# Patient Record
Sex: Female | Born: 2000 | Hispanic: Yes | Marital: Single | Smoking: Never smoker
Health system: Southern US, Community
[De-identification: ages and names within clinical notes are randomized; demographics above are authoritative.]

## PROBLEM LIST (undated history)

## (undated) DIAGNOSIS — T7840XA Allergy, unspecified, initial encounter: Secondary | ICD-10-CM

## (undated) HISTORY — DX: Allergy, unspecified, initial encounter: T78.40XA

---
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
## 2018-01-02 ENCOUNTER — Ambulatory Visit: Payer: BC Managed Care – PPO | Admitting: Family Medicine

## 2018-01-02 ENCOUNTER — Encounter: Payer: Self-pay | Admitting: Family Medicine

## 2018-01-02 VITALS — BP 113/71 | HR 63 | Ht 62.0 in | Wt 111.0 lb

## 2018-01-02 DIAGNOSIS — Z30011 Encounter for initial prescription of contraceptive pills: Secondary | ICD-10-CM

## 2018-01-02 MED ORDER — LO LOESTRIN FE 1 MG-10 MCG / 10 MCG PO TABS: 1.0000 | ORAL_TABLET | Freq: Every day | ORAL | 3 refills | Status: DC

## 2018-01-02 NOTE — Progress Notes (Signed)
   Subjective:    Patient ID: Stacey Mayer, female    DOB: January 06, 2001, 17 y.o.   MRN: [id number]  HPI Patient seen for contraception management.  Patient was seen approximately 16 months ago by a different OB.  She was given a 1 month sample of Lo Loestrin for contraception which she took without complications.  She never followed up with that office.  She has engaged in unprotected sex, but has not become pregnant.  She has normal periods that are about every 28 to 30 days.  Normally bleeds to 5 to 6 days and is moderately heavy.  Would like to continue on birth control pills.  No personal or family history of blood clots.  Patient is a non-smoker.  No medications.  I have reviewed the patients past medical, family, and social history.  I have reviewed the patient's medication list and allergies.  Review of Systems     Objective:   Physical Exam  Constitutional: She is oriented to person, place, and time. She appears well-developed and well-nourished.  Cardiovascular: Normal rate and regular rhythm.  Pulmonary/Chest: No stridor. No respiratory distress. She has no wheezes. She has no rales.  Abdominal: Soft. She exhibits no distension. There is no tenderness.  Musculoskeletal: Normal range of motion. She exhibits no edema or deformity.  Neurological: She is alert and oriented to person, place, and time.  Skin: Capillary refill takes 2 to 3 seconds.  Psychiatric: She has a normal mood and affect. Her behavior is normal. Thought content normal.      Assessment & Plan:  1. Encounter for initial prescription of contraceptive pills Discussing pros and cons of different pill choices.  We will restart the patient on low Loestrin.  Discussed possibility of breakthrough spotting and bleeding.  Also discussed incidence of DVTs and most common side effects.  Patient follow-up in 3 to 4 months to evaluate problems with contraception.  We also discussed how to take medication and what to do when she  misses a dose.  Also discussed that OCPs do not protect her against STDs and recommended using barrier devices such as condoms.

## 2018-10-22 ENCOUNTER — Other Ambulatory Visit: Payer: Self-pay

## 2018-10-22 ENCOUNTER — Ambulatory Visit (INDEPENDENT_AMBULATORY_CARE_PROVIDER_SITE_OTHER): Payer: BC Managed Care – PPO

## 2018-10-22 VITALS — BP 140/80 | HR 88 | Temp 98.2°F

## 2018-10-22 DIAGNOSIS — N898 Other specified noninflammatory disorders of vagina: Secondary | ICD-10-CM | POA: Diagnosis not present

## 2018-10-22 DIAGNOSIS — N760 Acute vaginitis: Secondary | ICD-10-CM

## 2018-10-22 DIAGNOSIS — B9689 Other specified bacterial agents as the cause of diseases classified elsewhere: Secondary | ICD-10-CM | POA: Diagnosis not present

## 2018-10-22 DIAGNOSIS — Z113 Encounter for screening for infections with a predominantly sexual mode of transmission: Secondary | ICD-10-CM | POA: Diagnosis not present

## 2018-10-22 NOTE — Progress Notes (Signed)
Patient states she has noticed an increase in white'/yellowish discharge since starting birth control. Denies any new sexual partners and states she is not currently sexually active.   Patient does what GC/CHl run and gave verbal consent for it.   Patient is not having any itching or burning. Patient doesn't really notice any smell or odor to discharge. Patient states she is wearing a panty liner everyday - encourage to try an not do that as it can harbor more bacteria. Encouraged to try and take a probiotic every day as well.   Will send self swab for culture and recommend treatment off once results are back.  Armandina Stammer RN

## 2018-10-23 ENCOUNTER — Other Ambulatory Visit: Payer: Self-pay | Admitting: Family Medicine

## 2018-10-26 ENCOUNTER — Telehealth: Payer: Self-pay

## 2018-10-26 DIAGNOSIS — N760 Acute vaginitis: Secondary | ICD-10-CM

## 2018-10-26 DIAGNOSIS — B9689 Other specified bacterial agents as the cause of diseases classified elsewhere: Secondary | ICD-10-CM

## 2018-10-26 DIAGNOSIS — A749 Chlamydial infection, unspecified: Secondary | ICD-10-CM

## 2018-10-26 LAB — CERVICOVAGINAL ANCILLARY ONLY
Bacterial vaginitis: POSITIVE — AB
Candida vaginitis: NEGATIVE
Chlamydia: POSITIVE — AB
Neisseria Gonorrhea: NEGATIVE

## 2018-10-26 MED ORDER — AZITHROMYCIN 500 MG PO TABS: 1000.0000 mg | ORAL_TABLET | Freq: Every day | ORAL | 0 refills | Status: AC

## 2018-10-26 MED ORDER — METRONIDAZOLE 500 MG PO TABS: 500.0000 mg | ORAL_TABLET | Freq: Two times a day (BID) | ORAL | 0 refills | Status: DC

## 2018-10-26 NOTE — Telephone Encounter (Signed)
Called pt with results.Pt made aware that she tested positive for Chlamydia and BV. Pt advised to not have intercourse for ten days after she and partner are treated. Medication was sent to the pharmacy. STD form was fax[facility] to [facility]. Understanding was voiced.

## 2018-11-10 ENCOUNTER — Telehealth: Payer: Self-pay

## 2018-11-10 NOTE — Telephone Encounter (Signed)
Pt called the message line stating that she had some yellow discharge after being treated for BV and chlamydia. Pt also states that her menstrual went off during the time that she finished her antibiotics two days ago. Advised pt to give it a few days to see if symptoms improve since she just finished her treatment. Pt states that at this time she is not having any discharge. Pt advised to call back if she has any questions. Understanding was voiced.  Stacey Mayer l Rashaun Curl, CMA

## 2018-11-16 ENCOUNTER — Telehealth: Payer: Self-pay

## 2018-11-16 MED ORDER — FLUCONAZOLE 100 MG PO TABS: 100.0000 mg | ORAL_TABLET | Freq: Every day | ORAL | 1 refills | Status: DC

## 2018-11-16 NOTE — Telephone Encounter (Signed)
Patient called and has finished all antibiotics for BC and CHl. Patient is now having vaginal itching and a slight discharge. Patient instructed to take on diflucan now and can repeat after 48 hours. Armandina Stammer RN

## 2018-11-26 ENCOUNTER — Other Ambulatory Visit: Payer: Self-pay | Admitting: Family Medicine

## 2018-12-08 ENCOUNTER — Ambulatory Visit: Payer: BC Managed Care – PPO | Admitting: Advanced Practice Midwife

## 2018-12-08 ENCOUNTER — Other Ambulatory Visit: Payer: Self-pay

## 2018-12-08 ENCOUNTER — Encounter: Payer: Self-pay | Admitting: Advanced Practice Midwife

## 2018-12-08 VITALS — BP 120/64 | HR 68 | Wt 105.0 lb

## 2018-12-08 DIAGNOSIS — N898 Other specified noninflammatory disorders of vagina: Secondary | ICD-10-CM

## 2018-12-08 NOTE — Patient Instructions (Signed)
Menstruation  Menstruation, also known as a menstrual period, is the monthly shedding of the lining of the uterus. The uterus is the organ in the lower abdomen where a baby grows during pregnancy. Menstruation involves the passing of blood, tissue, fluid, and mucus. The flow of blood usually occurs during 3-7 consecutive days each month. Girls usually start their periods between the ages of 12 and 8, but some girls may be older or younger when they start their period. Some girls have regular monthly menstrual cycles right from the beginning. However, it is not unusual to have only a couple of drops of blood or spotting when first starting to have periods. It is also not unusual to have two periods a month or miss a month or two when first starting to have periods. Women will continue to have periods until they reach menopause, which usually occurs between the ages of 12 and 78. What are the symptoms? During your period, you pass blood, tissue, fluid, and mucus out of your vagina. Periods are different for each woman and girl. You may experience:  Bleeding that lasts for 3-7 days. A little more or less bleeding is normal.  Occasional heavy bleeding.  Cramps in the lower abdomen.  Aching or pain in the lower back area.  Sore breasts.  Dizziness.  Nausea.  Diarrhea. Other symptoms may occur 5-10 days before your menstrual period starts. These symptoms are referred to as premenstrual syndrome (PMS). These symptoms can include:  Headache.  Breast tenderness and swelling.  Bloating.  Tiredness (fatigue).  Mood changes.  Craving for certain foods. How does the menstrual cycle happen? A period is part of a woman's menstrual cycle, which is a series of changes that the body goes through to get ready to become pregnant. The menstrual cycle usually lasts about 28 days, meaning that you will get your period about every 28 days if you do not get pregnant. However, some women get their periods  as soon as every 21 days or as late as every 35 days. Hormones control the menstrual cycle. Hormones are chemicals that the body produces to regulate different body functions. These hormones trigger changes in your uterus. Every month, the lining of your uterus gets thicker to prepare for pregnancy. And every month that you do not get pregnant, your uterus gets rid of its thick lining and cleans itself out. This is your period. How do I know if my period is not normal? Periods are different for everyone. Your period may last for a longer or shorter time than usual, and bleeding may be light or heavy. Signs that your period may not be normal include:  Bleeding very heavily, such as soaking through a tampon or pad in 1-2 hours.  Bleeding for many more days than normal.  Bleeding after you have sex.  Cramps that are so painful that you cannot do your daily activities.  Cramps that get much worse than they used to be.  Bleeding in between periods.  Missing your period for longer than 3 months.  Your menstrual cycle becoming irregular, when it used to be regular. Follow these instructions at home:  Keep track of your periods by using a calendar.  If you use tampons, use the least absorbent possible to avoid complications such as toxic shock syndrome.  Do not leave tampons in the vagina overnight or longer than 8 hours.  Wear a sanitary pad overnight.  To help relieve pain and discomfort during your period: ? Take an  over-the-counter pain reliever as told by your health care provider. ? Use a heating pad or heat wrap on your abdomen to ease cramping. ? Exercise 3-5 times a week or more. ? Avoid foods and drinks that you know will make your symptoms worse before or during your period. This includes foods that contain:  Caffeine.  Salt.  Sugar. Contact a health care provider if:  You have signs that your period may not be normal.  You develop a fever with your period.  Your  periods are lasting more than 7 days.  You develop clots with your period and never had clots before.  You cannot get relief for your symptoms from over-the-counter medicine.  Your period has not started, and it has been longer than 35 days. Get help right away if:  Your period is so heavy that you have to change pads or tampons every 30 minutes.  You have any symptoms of toxic shock syndrome (TSS), such as: ? A high fever. ? Vomiting or diarrhea. ? Red skin that looks like a sunburn. ? Red eyes. ? Fainting or feeling dizzy. ? Sore throat. ? Muscle aches. If you develop any of these symptoms, visit your health care provider immediately. TSS is a serious health condition that can be caused by wearing a tampon for too long. Summary  Menstruation, also known as a menstrual period, is the monthly shedding of the lining of the uterus.  During your period, you pass blood, tissue, fluid, and mucus out of your vagina.  Keep track of your periods by using a calendar.  Contact a health care provider if you have signs that your period may not be normal. This information is not intended to replace advice given to you by your health care provider. Make sure you discuss any questions you have with your health care provider. Document Released: 05/24/2002 Document Revised: 07/31/2016 Document Reviewed: 07/31/2016 Elsevier Interactive Patient Education  2019 [employer] Safe Sex Practicing safe sex means taking steps before and during sex to reduce your risk of:  Getting an STD (sexually transmitted disease).  Giving your partner an STD.  Unwanted pregnancy. How can I practice safe sex? To practice safe sex:  Limit your sexual partners to only one partner who is having sex with only you.  Avoid using alcohol and recreational drugs before having sex. These substances can affect your judgment.  Before having sex with a new partner: ? Talk to your partner about past partners, past  STDs, and drug use. ? You and your partner should be screened for STDs and discuss the results with each other.  Check your body regularly for sores, blisters, rashes, or unusual discharge. If you notice any of these problems, visit your health care provider.  If you have symptoms of an infection or you are being treated for an STD, avoid sexual contact.  While having sex, use a condom. Make sure to: ? Use a condom every time you have vaginal, oral, or anal sex. Both females and males should wear condoms during oral sex. ? Keep condoms in place from the beginning to the end of sexual activity. ? Use a latex condom, if possible. Latex condoms offer the best protection. ? Use only water-based lubricants or oils to lubricate a condom. Using petroleum-based lubricants or oils will weaken the condom and increase the chance that it will break.  See your health care provider for regular screenings, exams, and tests for STDs.  Talk with your health care provider  about the form of birth control (contraception) that is best for you.  Get vaccinated against hepatitis B and human papillomavirus (HPV).  If you are at risk of being infected with HIV (human immunodeficiency virus), talk with your health care provider about taking a prescription medicine to prevent HIV infection. You are considered at risk for HIV if: ? You are a man who has sex with other men. ? You are a heterosexual man or woman who is sexually active with more than one partner. ? You take drugs by injection. ? You are sexually active with a partner who has HIV. This information is not intended to replace advice given to you by your health care provider. Make sure you discuss any questions you have with your health care provider. Document Released: 07/11/2004 Document Revised: 10/18/2015 Document Reviewed: 04/23/2015 Elsevier Interactive Patient Education  2019 [employer]

## 2018-12-08 NOTE — Progress Notes (Signed)
Patient had vaginal self swab on 10-22-18 positive for chl and BV and was treated for those. Also treated four days later on 11-10-2018 with diflucan. Patient still having discharge.

## 2018-12-09 ENCOUNTER — Encounter: Payer: Self-pay | Admitting: Advanced Practice Midwife

## 2018-12-09 LAB — CERVICOVAGINAL ANCILLARY ONLY
Bacterial vaginitis: POSITIVE — AB
Candida vaginitis: NEGATIVE
Chlamydia: NEGATIVE
Neisseria Gonorrhea: NEGATIVE
Trichomonas: NEGATIVE

## 2018-12-09 NOTE — Progress Notes (Signed)
   Subjective:    Patient ID: Stacey Mayer, female    DOB: 11-02-2000, 18 y.o.   MRN: [id number]  Vaginal Discharge The patient's primary symptoms include vaginal discharge. The patient's pertinent negatives include no genital itching, genital lesions, genital odor, pelvic pain or vaginal bleeding. This is a recurrent problem. The current episode started in the past 7 days. The problem occurs constantly. The problem has been unchanged. The patient is experiencing no pain. She is not pregnant. Pertinent negatives include no abdominal pain, back pain, chills, constipation or fever. The vaginal discharge was white. There has been no bleeding. Nothing aggravates the symptoms. She has tried nothing for the symptoms. Sexual activity: States no longer with former partner.      Review of Systems  Constitutional: Negative for chills and fever.  Gastrointestinal: Negative for abdominal pain and constipation.  Genitourinary: Positive for vaginal discharge. Negative for pelvic pain.  Musculoskeletal: Negative for back pain.       Objective:   Physical Exam Constitutional:      General: She is not in acute distress.    Appearance: Normal appearance.  Neck:     Musculoskeletal: Normal range of motion.  Cardiovascular:     Rate and Rhythm: Normal rate.  Pulmonary:     Effort: Pulmonary effort is normal.  Abdominal:     General: There is no distension.     Tenderness: There is no abdominal tenderness. There is no guarding.  Genitourinary:    General: Normal vulva.     Vagina: Vaginal discharge (scant) present.  Skin:    General: Skin is warm and dry.  Neurological:     General: No focal deficit present.     Mental Status: She is alert.  Psychiatric:        Mood and Affect: Mood normal.           Assessment & Plan:  A:  Vaginal discharge, likely physiologic      Hx chlamydia and BV, test of cure  P;   Discussed the discharge is likely physiologic       Will send culture but not  treat at this time

## 2018-12-10 ENCOUNTER — Telehealth: Payer: Self-pay

## 2018-12-10 DIAGNOSIS — B9689 Other specified bacterial agents as the cause of diseases classified elsewhere: Secondary | ICD-10-CM

## 2018-12-10 DIAGNOSIS — N760 Acute vaginitis: Secondary | ICD-10-CM

## 2018-12-10 NOTE — Telephone Encounter (Signed)
Called pt about positive BV results, left message for pt to call the office back.  chiquita l wilson, CMA

## 2018-12-11 MED ORDER — METRONIDAZOLE 500 MG PO TABS
500.0000 mg | ORAL_TABLET | Freq: Two times a day (BID) | ORAL | 0 refills | Status: DC
Start: 2018-12-11 — End: 2019-03-19

## 2018-12-11 MED ORDER — FLUCONAZOLE 100 MG PO TABS: 100.0000 mg | ORAL_TABLET | Freq: Every day | ORAL | 1 refills | Status: DC

## 2018-12-11 NOTE — Telephone Encounter (Signed)
Patient call back and made aware of positive bacterial vaginosis results. Patient made aware to take all of her flagyl and that she make take a diflucan at the end of the treatment if she is having yeast symptoms.   Patient and I discuss way to help with recurrent BV> for example: wearing loose cotton fitting underwear, avoid scent products like panty liners, use Dove unscented soap products. Patient states understanding. Kathrene Alu RN

## 2018-12-23 ENCOUNTER — Telehealth: Payer: Self-pay

## 2018-12-23 NOTE — Telephone Encounter (Signed)
Pt called and left message on nurse line about abnormal vaginal discharge. Left message for pt to call the office back.  Tayra Dawe l Nattalie Santiesteban, CMA

## 2018-12-23 NOTE — Telephone Encounter (Signed)
Pt called the office stating discharge seems heavy when she is active. Pt was treated for BV over 1 week ago. Pt advised to call after her cycle if she is still having discharge. Understanding was voiced.  chiquita l wilson, CMA

## 2019-01-05 ENCOUNTER — Other Ambulatory Visit: Payer: Self-pay

## 2019-01-05 ENCOUNTER — Ambulatory Visit (INDEPENDENT_AMBULATORY_CARE_PROVIDER_SITE_OTHER): Payer: BC Managed Care – PPO | Admitting: Advanced Practice Midwife

## 2019-01-05 ENCOUNTER — Encounter: Payer: Self-pay | Admitting: Advanced Practice Midwife

## 2019-01-05 VITALS — BP 105/60 | HR 62 | Ht 62.0 in | Wt 107.0 lb

## 2019-01-05 DIAGNOSIS — Z113 Encounter for screening for infections with a predominantly sexual mode of transmission: Secondary | ICD-10-CM | POA: Diagnosis not present

## 2019-01-05 DIAGNOSIS — N898 Other specified noninflammatory disorders of vagina: Secondary | ICD-10-CM | POA: Diagnosis not present

## 2019-01-05 NOTE — Progress Notes (Deleted)
  Subjective:    Stacey Mayer is being seen today for her first obstetrical visit.  This {is/is not:9024} a planned pregnancy. She is at Unknown gestation. Her obstetrical history is significant for {ob risk factors:10154}. Relationship with FOB: {fob:16621}. Patient {does/does not:19097} intend to breast feed. Pregnancy history fully reviewed.  Patient reports {sx:14538}.  Review of Systems:   Review of Systems  Objective:     BP 105/60   Pulse 62   Ht 5\' 2"  (1.575 m)   Wt 48.5 kg   LMP 12/24/2018   BMI 19.57 kg/m  Physical Exam  Exam    Assessment:    Pregnancy: G0P0000 There are no active problems to display for this patient.      Plan:     Initial labs drawn. Prenatal vitamins. Problem list reviewed and updated. AFP3 discussed: {requests/ordered/declines:14581}. Role of ultrasound in pregnancy discussed; fetal survey: {requests/ordered/declines:14581}. Amniocentesis discussed: {amniocentesis:14582}. Follow up in {numbers 0-4:31231} weeks. ***% of *** min visit spent on counseling and coordination of care.  ***   Hansel Feinstein 01/05/2019

## 2019-01-05 NOTE — Patient Instructions (Signed)
Probiotics Probiotics are the good bacteria and yeasts that live in your body and keep your digestive system healthy. Probiotics also help your body's defense system (immune system) and protect your body against the growth of harmful bacteria. Your health care provider may recommend taking a probiotic if you are taking antibiotics or have certain medical conditions, such as:  Diarrhea.  Constipation.  Irritable bowel syndrome.  Lung infections.  Yeast infections.  Acne, eczema, and other skin conditions.  Frequent urinary tract infections. What affects the balance of bacteria in my body? The balance of good bacteria in your body can be affected by:  Antibiotic medicines. These medicines treat infections caused by bacteria. Unfortunately, they may kill the good bacteria in your body as well as the bad bacteria.  Certain medical conditions. Conditions related to an imbalance of bacteria include: ? Stomach and intestine (gastrointestinal) infections. ? Lung infections. ? Skin infections. ? Vaginal infections. ? Inflammatory bowel diseases. ? Stomach ulcers (gastric ulcers). ? Tooth decay and gum disease (periodontal disease).  Stress.  Poor diet. What type of probiotic is right for me? Probiotics contain different types of bacteria (strains). Strains commonly found in probiotics include:  Lactobacillus.  Saccharomyces.  Bifidobacterium. Specific strains have been shown to be more effective for certain health conditions. Ask your health care provider which strain or strains you should use and how often. Probiotics come in many different forms, strain combinations, and strengths. Some may need to be refrigerated. Always read the label for storage and usage instructions. Certain foods, such as yogurt, contain probiotics. Probiotics can also be bought as a supplement at a pharmacy, health food store, or grocery store. Talk to your health care provider before starting any  supplement. What are the side effects of probiotics? Some people have side effects when taking probiotics. Side effects are usually temporary and may include:  Gas.  Bloating.  Cramping. Serious side effects are rare. Follow these instructions at home:   If you are taking probiotics with antibiotics: ? Wait at least 2 hours between taking your medicine and the probiotic. ? Eat foods high in fiber, such as whole grains, beans, and vegetables. These foods can help good bacteria grow. ? Avoid certain foods as told by your health care provider. Summary  Probiotics are the good bacteria and yeasts that live in your body and keep you and your digestive system healthy.  Certain foods, such as yogurt, contain probiotics.  Probiotics can be taken as supplements. They can be bought at a pharmacy, health food store, or grocery store. They come in many different forms, strain combinations, and strengths.  Be sure to talk with your health care provider before taking a probiotic supplement. This information is not intended to replace advice given to you by your health care provider. Make sure you discuss any questions you have with your health care provider. Document Released: 12/29/2013 Document Revised: 02/20/2018 Document Reviewed: 06/18/2017 Elsevier Patient Education  2020 [employer] Health Maintenance, Female Adopting a healthy lifestyle and getting preventive care are important in promoting health and wellness. Ask your health care provider about:  The right schedule for you to have regular tests and exams.  Things you can do on your own to prevent diseases and keep yourself healthy. What should I know about diet, weight, and exercise? Eat a healthy diet   Eat a diet that includes plenty of vegetables, fruits, low-fat dairy products, and lean protein.  Do not eat a lot of foods that are high  in solid fats, added sugars, or sodium. Maintain a healthy weight Body mass index  (BMI) is used to identify weight problems. It estimates body fat based on height and weight. Your health care provider can help determine your BMI and help you achieve or maintain a healthy weight. Get regular exercise Get regular exercise. This is one of the most important things you can do for your health. Most adults should:  Exercise for at least 150 minutes each week. The exercise should increase your heart rate and make you sweat (moderate-intensity exercise).  Do strengthening exercises at least twice a week. This is in addition to the moderate-intensity exercise.  Spend less time sitting. Even light physical activity can be beneficial. Watch cholesterol and blood lipids Have your blood tested for lipids and cholesterol at 20 years of age, then have this test every 5 years. Have your cholesterol levels checked more often if:  Your lipid or cholesterol levels are high.  You are older than 20 years of age.  You are at high risk for heart disease. What should I know about cancer screening? Depending on your health history and family history, you may need to have cancer screening at various ages. This may include screening for:  Breast cancer.  Cervical cancer.  Colorectal cancer.  Skin cancer.  Lung cancer. What should I know about heart disease, diabetes, and high blood pressure? Blood pressure and heart disease  High blood pressure causes heart disease and increases the risk of stroke. This is more likely to develop in people who have high blood pressure readings, are of African descent, or are overweight.  Have your blood pressure checked: ? Every 3-5 years if you are 6018-20 years of age. ? Every year if you are 20 years old or older. Diabetes Have regular diabetes screenings. This checks your fasting blood sugar level. Have the screening done:  Once every three years after age 20 if you are at a normal weight and have a low risk for diabetes.  More often and at a  younger age if you are overweight or have a high risk for diabetes. What should I know about preventing infection? Hepatitis B If you have a higher risk for hepatitis B, you should be screened for this virus. Talk with your health care provider to find out if you are at risk for hepatitis B infection. Hepatitis C Testing is recommended for:  Everyone born from [birth year] through 1967.  Anyone with known risk factors for hepatitis C. Sexually transmitted infections (STIs)  Get screened for STIs, including gonorrhea and chlamydia, if: ? You are sexually active and are younger than 20 years of age. ? You are older than 20 years of age and your health care provider tells you that you are at risk for this type of infection. ? Your sexual activity has changed since you were last screened, and you are at increased risk for chlamydia or gonorrhea. Ask your health care provider if you are at risk.  Ask your health care provider about whether you are at high risk for HIV. Your health care provider may recommend a prescription medicine to help prevent HIV infection. If you choose to take medicine to prevent HIV, you should first get tested for HIV. You should then be tested every 3 months for as long as you are taking the medicine. Pregnancy  If you are about to stop having your period (premenopausal) and you may become pregnant, seek counseling before you get pregnant.  Take  400 to 800 micrograms (mcg) of folic acid every day if you become pregnant.  Ask for birth control (contraception) if you want to prevent pregnancy. Osteoporosis and menopause Osteoporosis is a disease in which the bones lose minerals and strength with aging. This can result in bone fractures. If you are 17 years old or older, or if you are at risk for osteoporosis and fractures, ask your health care provider if you should:  Be screened for bone loss.  Take a calcium or vitamin D supplement to lower your risk of fractures.  Be  given hormone replacement therapy (HRT) to treat symptoms of menopause. Follow these instructions at home: Lifestyle  Do not use any products that contain nicotine or tobacco, such as cigarettes, e-cigarettes, and chewing tobacco. If you need help quitting, ask your health care provider.  Do not use street drugs.  Do not share needles.  Ask your health care provider for help if you need support or information about quitting drugs. Alcohol use  Do not drink alcohol if: ? Your health care provider tells you not to drink. ? You are pregnant, may be pregnant, or are planning to become pregnant.  If you drink alcohol: ? Limit how much you use to 0-1 drink a day. ? Limit intake if you are breastfeeding.  Be aware of how much alcohol is in your drink. In the [place] one drink equals one 12 oz bottle of beer (355 mL), one 5 oz glass of wine (148 mL), or one 1 oz glass of hard liquor (44 mL). General instructions  Schedule regular health, dental, and eye exams.  Stay current with your vaccines.  Tell your health care provider if: ? You often feel depressed. ? You have ever been abused or do not feel safe at home. Summary  Adopting a healthy lifestyle and getting preventive care are important in promoting health and wellness.  Follow your health care provider's instructions about healthy diet, exercising, and getting tested or screened for diseases.  Follow your health care provider's instructions on monitoring your cholesterol and blood pressure. This information is not intended to replace advice given to you by your health care provider. Make sure you discuss any questions you have with your health care provider. Document Released: 12/17/2010 Document Revised: 05/27/2018 Document Reviewed: 05/27/2018 Elsevier Patient Education  2020 [employer].

## 2019-01-05 NOTE — Progress Notes (Signed)
   Subjective:    Patient ID: Stacey Mayer, female    DOB: 2000-12-21, 18 y.o.   MRN: [id number]  This is a 18 y.o. female who presents with c/o vaginal discharge. No odor.  Just worried it is BV again.  Treated with Flagyl in June  Vaginal Discharge The patient's primary symptoms include vaginal discharge. The patient's pertinent negatives include no genital itching, genital lesions, genital odor, pelvic pain or vaginal bleeding. This is a recurrent problem. The current episode started in the past 7 days. The problem occurs constantly. The problem has been unchanged. The patient is experiencing no pain. Pertinent negatives include no abdominal pain, back pain, constipation, diarrhea or fever. The vaginal discharge was white. There has been no bleeding. She has not been passing clots. She has not been passing tissue. Nothing aggravates the symptoms. She has tried nothing for the symptoms. She is not sexually active. She uses nothing for contraception.   Review of Systems  Constitutional: Negative for fever.  Gastrointestinal: Negative for abdominal pain, constipation and diarrhea.  Genitourinary: Positive for vaginal discharge. Negative for pelvic pain.  Musculoskeletal: Negative for back pain.       Objective:   Physical Exam Constitutional:      Appearance: Normal appearance. She is not diaphoretic.  Neck:     Musculoskeletal: Normal range of motion.  Cardiovascular:     Rate and Rhythm: Normal rate.  Pulmonary:     Effort: Pulmonary effort is normal.  Abdominal:     General: Abdomen is flat.     Palpations: There is no mass.     Tenderness: There is no abdominal tenderness. There is no guarding.  Genitourinary:    Vagina: Vaginal discharge (minimal) present.     Comments: No lesions or odor  Musculoskeletal: Normal range of motion.  Skin:    General: Skin is warm and dry.  Neurological:     General: No focal deficit present.     Mental Status: She is alert.  Psychiatric:         Mood and Affect: Mood normal.           Assessment & Plan:  A;  Vaginal discharge, likely physiologic      History of bacterial vaginosis  P:   Cultures sent      Discussed probiotics, has not started them yet      Will treat based on results      Safe sex reviewed      Normal physiologic  Discharge discussed

## 2019-01-07 LAB — CERVICOVAGINAL ANCILLARY ONLY
Bacterial vaginitis: NEGATIVE
Candida vaginitis: NEGATIVE
Chlamydia: NEGATIVE
Neisseria Gonorrhea: NEGATIVE
Trichomonas: NEGATIVE

## 2019-01-11 ENCOUNTER — Telehealth: Payer: Self-pay

## 2019-01-11 NOTE — Telephone Encounter (Signed)
Pt called the office requesting wet prep results.Pt made aware that wet prep was negative. Stacey Mayer l Stacey Mayer, CMA

## 2019-01-15 ENCOUNTER — Telehealth: Payer: Self-pay

## 2019-01-15 NOTE — Telephone Encounter (Signed)
Patient called stating that she is still having a yellow tinged discharge at time.   Patient made aware again that all of her cericovaginal swab results were negative. Patient is not having any other symptoms.  Questioned patient if she has changed soaps, laundry detergents, or is sexually active with new partner. Patient states no. Patient just states she notices her discharge when she is standing at work- made her aware that we have natural discharge changes during our period cycles and it could be that since her swab was negative and she is not having itchin,burning, or smell.   Patient states understanding. Kathrene Alu RN

## 2019-03-19 ENCOUNTER — Ambulatory Visit: Payer: BC Managed Care – PPO | Admitting: Family

## 2019-03-19 ENCOUNTER — Other Ambulatory Visit: Payer: Self-pay

## 2019-03-19 ENCOUNTER — Encounter: Payer: Self-pay | Admitting: Family

## 2019-03-19 VITALS — BP 112/67 | HR 64 | Temp 97.8°F | Resp 16 | Ht 62.0 in | Wt 108.0 lb

## 2019-03-19 DIAGNOSIS — L509 Urticaria, unspecified: Secondary | ICD-10-CM

## 2019-03-19 MED ORDER — CETIRIZINE HCL 10 MG PO TABS: 10.0000 mg | ORAL_TABLET | Freq: Every day | ORAL | 11 refills | Status: DC

## 2019-03-19 MED ORDER — PREDNISONE 10 MG PO TABS: ORAL_TABLET | ORAL | 0 refills | Status: DC

## 2019-03-19 NOTE — Progress Notes (Signed)
Subjective:    Patient ID: Stacey Mayer, female    DOB: 2001-04-16, 18 y.o.   MRN: [id number]  HPI  Patient is a 18 yr old female who presents today with chief complaint of rash. Reports that she has a rash all over her body about 1 month ago. She went to urgent care and was prescribed steroids which she states resolved the rash initially. However, 2 days ago the rash returned on her neck.  She denies known food allergy. Denies tongue or lip swelling. Denies wheezing or SOB.      Review of Systems    see HPI  No past medical history on file.   Social History   Socioeconomic History  . Marital status: Single    Spouse name: Not on file  . Number of children: Not on file  . Years of education: Not on file  . Highest education level: Not on file  Occupational History  . Not on file  Social Needs  . Financial resource strain: Not on file  . Food insecurity    Worry: Not on file    Inability: Not on file  . Transportation needs    Medical: Not on file    Non-medical: Not on file  Tobacco Use  . Smoking status: Never Smoker  . Smokeless tobacco: Never Used  Substance and Sexual Activity  . Alcohol use: No  . Drug use: No  . Sexual activity: Yes    Birth control/protection: Condom  Lifestyle  . Physical activity    Days per week: Not on file    Minutes per session: Not on file  . Stress: Not on file  Relationships  . Social Herbalist on phone: Not on file    Gets together: Not on file    Attends religious service: Not on file    Active member of club or organization: Not on file    Attends meetings of clubs or organizations: Not on file    Relationship status: Not on file  . Intimate partner violence    Fear of current or ex partner: Not on file    Emotionally abused: Not on file    Physically abused: Not on file    Forced sexual activity: Not on file  Other Topics Concern  . Not on file  Social History Narrative  . Not on file    No past  surgical history on file.  No family history on file.  No Known Allergies  No current outpatient medications on file prior to visit.   No current facility-administered medications on file prior to visit.     BP 112/67 (BP Location: Right Arm, Patient Position: Sitting, Cuff Size: Small)   Pulse 64   Temp 97.8 F (36.6 C) (Temporal)   Resp 16   Ht 5\' 2"  (1.575 m)   Wt 108 lb (49 kg)   SpO2 100%   BMI 19.75 kg/m    Objective:   Physical Exam Constitutional:      Appearance: She is well-developed.  Neck:     Musculoskeletal: Neck supple.     Thyroid: No thyromegaly.  Cardiovascular:     Rate and Rhythm: Normal rate and regular rhythm.     Heart sounds: Normal heart sounds. No murmur.  Pulmonary:     Effort: Pulmonary effort is normal. No respiratory distress.     Breath sounds: Normal breath sounds. No wheezing.  Skin:    General: Skin is warm and dry.  Comments: Some hives noted on neck.    Neurological:     Mental Status: She is alert and oriented to person, place, and time.  Psychiatric:        Behavior: Behavior normal.        Thought Content: Thought content normal.        Judgment: Judgment normal.           Assessment & Plan:  Urticaria- recurrent. Will rx with prednisone taper, add daily zyrtec, refer to allergist for further testing.  Pt advised as follows:  Call if symptoms worsen or if symptoms fail to improve. Go to the ER if you develop tongue/lip swelling.

## 2019-03-19 NOTE — Patient Instructions (Addendum)
Please begin prednisone taper.   Add zyrtec 10mg  once daily. Call if symptoms worsen or if symptoms fail to improve. Go to the ER if you develop tongue/lip swelling.

## 2019-03-24 ENCOUNTER — Ambulatory Visit: Payer: BC Managed Care – PPO | Admitting: Medical

## 2019-03-24 ENCOUNTER — Other Ambulatory Visit: Payer: Self-pay

## 2019-03-24 ENCOUNTER — Encounter: Payer: Self-pay | Admitting: Medical

## 2019-03-24 VITALS — BP 114/66 | HR 64 | Temp 97.7°F | Resp 16 | Ht 62.0 in | Wt 110.6 lb

## 2019-03-24 DIAGNOSIS — T7840XD Allergy, unspecified, subsequent encounter: Secondary | ICD-10-CM | POA: Diagnosis not present

## 2019-03-24 DIAGNOSIS — Z124 Encounter for screening for malignant neoplasm of cervix: Secondary | ICD-10-CM | POA: Diagnosis not present

## 2019-03-24 NOTE — Patient Instructions (Signed)
You have 2 episodes of allergic reaction with cause unknown some response to treatment with higher dose steroids. Will follow allergy testing results.   If any recurrent rash/allergic type reaction can schedule video visit or come in.  Follow up as needed

## 2019-03-24 NOTE — Progress Notes (Signed)
Subjective:    Patient ID: Stacey Mayer, female    DOB: 13-Jan-2001, 18 y.o.   MRN: [id number]  HPI  Pt in for second time. Was here for urticaria on 03-19-2019. Resolved with prednisone and zyrtec.  Pt is working at [employer] at [employer]. Pt states currently all her classes on line. Pt does not exercise regularly. She does eat healthy.   Pt does not have any major medical problems.  Recent allergic type rash around neck. Previous to that more diffuse rash. Never had sob, lip swelling, or wheezing with rash.  Pt does have allergies appointment upcoming next week.      Review of Systems  Constitutional: Negative for chills, fatigue and fever.  HENT: Negative for congestion and ear discharge.   Respiratory: Negative for chest tightness, shortness of breath and wheezing.   Cardiovascular: Negative for chest pain and palpitations.  Gastrointestinal: Negative for abdominal pain.  Genitourinary: Negative for difficulty urinating, dysuria and frequency.  Musculoskeletal: Negative for back pain, joint swelling and neck stiffness.  Skin: Negative for pallor and rash.  Neurological: Negative for dizziness, speech difficulty, weakness, numbness and headaches.  Hematological: Negative for adenopathy. Does not bruise/bleed easily.  Psychiatric/Behavioral: Negative for behavioral problems, confusion and suicidal ideas. The patient is not nervous/anxious.     No past medical history on file.   Social History   Socioeconomic History  . Marital status: Single    Spouse name: Not on file  . Number of children: Not on file  . Years of education: Not on file  . Highest education level: Not on file  Occupational History  . Not on file  Social Needs  . Financial resource strain: Not on file  . Food insecurity    Worry: Not on file    Inability: Not on file  . Transportation needs    Medical: Not on file    Non-medical: Not on file  Tobacco Use  . Smoking status: Never  Smoker  . Smokeless tobacco: Never Used  Substance and Sexual Activity  . Alcohol use: No  . Drug use: No  . Sexual activity: Yes    Birth control/protection: Condom  Lifestyle  . Physical activity    Days per week: Not on file    Minutes per session: Not on file  . Stress: Not on file  Relationships  . Social Herbalist on phone: Not on file    Gets together: Not on file    Attends religious service: Not on file    Active member of club or organization: Not on file    Attends meetings of clubs or organizations: Not on file    Relationship status: Not on file  . Intimate partner violence    Fear of current or ex partner: Not on file    Emotionally abused: Not on file    Physically abused: Not on file    Forced sexual activity: Not on file  Other Topics Concern  . Not on file  Social History Narrative  . Not on file    No past surgical history on file.  No family history on file.  No Known Allergies  Current Outpatient Medications on File Prior to Visit  Medication Sig Dispense Refill  . cetirizine (ZYRTEC) 10 MG tablet Take 1 tablet (10 mg total) by mouth daily. 30 tablet 11  . predniSONE (DELTASONE) 10 MG tablet 4 tabs by mouth once daily for 2 days, then 3 tabs daily  x 2 days, then 2 tabs daily x 2 days, then 1 tab daily x 2 days 20 tablet 0   No current facility-administered medications on file prior to visit.     BP 114/66   Pulse 64   Temp 97.7 F (36.5 C) (Temporal)   Resp 16   Ht 5\' 2"  (1.575 m)   Wt 110 lb 9.6 oz (50.2 kg)   SpO2 100%   BMI 20.23 kg/m       Objective:   Physical Exam  General- No acute distress. Pleasant patient. Neck- Full range of motion, no jvd Lungs- Clear, even and unlabored. Heart- regular rate and rhythm. Neurologic- CNII- XII grossly intact. Skin- no rash on inspection.       Assessment & Plan:  You have 2 episodes of allergic reaction with cause unknown some response to treatment with higher dose  steroids. Will follow allergy testing results.   If any recurrent rash/allergic type reaction can schedule video visit or come in.  Follow up as needed  , PA-C

## 2019-03-31 ENCOUNTER — Other Ambulatory Visit: Payer: Self-pay

## 2019-03-31 ENCOUNTER — Ambulatory Visit (INDEPENDENT_AMBULATORY_CARE_PROVIDER_SITE_OTHER): Payer: BC Managed Care – PPO | Admitting: Allergy

## 2019-03-31 ENCOUNTER — Encounter: Payer: Self-pay | Admitting: Allergy

## 2019-03-31 VITALS — BP 100/62 | HR 68 | Temp 97.6°F | Resp 16 | Ht 62.21 in | Wt 105.0 lb

## 2019-03-31 DIAGNOSIS — R21 Rash and other nonspecific skin eruption: Secondary | ICD-10-CM

## 2019-03-31 DIAGNOSIS — L509 Urticaria, unspecified: Secondary | ICD-10-CM

## 2019-03-31 DIAGNOSIS — J3089 Other allergic rhinitis: Secondary | ICD-10-CM | POA: Diagnosis not present

## 2019-03-31 NOTE — Patient Instructions (Addendum)
Today's skin testing showed: Positive to grass, trees, mold, dust mites and cat.  Mildly positive to fish and shrimp.    Take zyrtec (ceterizine) 10mg  daily at night and may take it twice a day if you have breakthrough symptoms.  If it makes you too drowsy let know.   Start proper skin care as below.  Start environmental control measures as below.   Follow up in 4 weeks or sooner if needed.  Skin care recommendations  Bath time: . Always use lukewarm water. AVOID very hot or cold water. [place] Keep bathing time to 5-10 minutes. . Do NOT use bubble bath. . Use a mild soap and use just enough to wash the dirty areas. . Do NOT scrub skin vigorously.  . After bathing, pat dry your skin with a towel. Do NOT rub or scrub the skin.  Moisturizers and prescriptions:  . ALWAYS apply moisturizers immediately after bathing (within 3 minutes). This helps to lock-in moisture. . Use the moisturizer several times a day over the whole body. Marland Kitchen summer moisturizers include: Aveeno, CeraVe, Cetaphil. Peri [place] winter moisturizers include: Aquaphor, Vaseline, Cerave, Cetaphil, Eucerin, Vanicream. . When using moisturizers along with medications, the moisturizer should be applied about one hour after applying the medication to prevent diluting effect of the medication or moisturize around where you applied the medications. When not using medications, the moisturizer can be continued twice daily as maintenance.  Laundry and clothing: . Avoid laundry products with added color or perfumes. . Use unscented hypo-allergenic laundry products such as Tide free, Cheer free & gentle, and All free and clear.  . If the skin still seems dry or sensitive, you can try double-rinsing the clothes. . Avoid tight or scratchy clothing such as wool. . Do not use fabric softeners or dyer sheets.  Reducing Pollen Exposure . Pollen seasons: trees (spring), grass (summer) and ragweed/weeds (fall). 10[birth year]24[birth year]1980 Keep windows closed in  your home and car to lower pollen exposure.  Marland Kitchen air conditioning in the bedroom and throughout the house if possible.  . Avoid going out in dry windy days - especially early morning. . Pollen counts are highest between 5 - 10 AM and on dry, hot and windy days.  . Save outside activities for late afternoon or after a heavy rain, when pollen levels are lower.  . Avoid mowing of grass if you have grass pollen allergy. Lilian Kapur Be aware that pollen can also be transported indoors on people and pets.  . Dry your clothes in an automatic dryer rather than hanging them outside where they might collect pollen.  . Rinse hair and eyes before bedtime. Mold Control . Mold and fungi can grow on a variety of surfaces provided certain temperature and moisture conditions exist.  . Outdoor molds grow on plants, decaying vegetation and soil. The major outdoor mold, Alternaria and Cladosporium, are found in very high numbers during hot and dry conditions. Generally, a late summer - fall peak is seen for common outdoor fungal spores. Rain will temporarily lower outdoor mold spore count, but counts rise rapidly when the rainy period ends. . The most important indoor molds are Aspergillus and Penicillium. Dark, humid and poorly ventilated basements are ideal sites for mold growth. The next most common sites of mold growth are the bathroom and the kitchen. Outdoor (Seasonal) Mold Control . Use air conditioning and keep windows closed. . Avoid exposure to decaying vegetation. 04-12-1976 Avoid leaf raking. . Avoid grain handling. . Consider wearing a  face mask if working in moldy areas.  Indoor (Perennial) Mold Control  . Maintain humidity below 50%. . Get rid of mold growth on hard surfaces with water, detergent and, if necessary, 5% bleach (do not mix with other cleaners). Then dry the area completely. If mold covers an area more than 10 square feet, consider hiring an indoor environmental professional. . For clothing, washing  with soap and water is best. If moldy items cannot be cleaned and dried, throw them away. . Remove sources e.g. contaminated carpets. . Repair and seal leaking roofs or pipes. Using dehumidifiers in damp basements may be helpful, but empty the water and clean units regularly to prevent mildew from forming. All rooms, especially basements, bathrooms and kitchens, require ventilation and cleaning to deter mold and mildew growth. Avoid carpeting on concrete or damp floors, and storing items in damp areas. Control of House Dust Mite Allergen . Dust mite allergens are a common trigger of allergy and asthma symptoms. While they can be found throughout the house, these microscopic creatures thrive in warm, humid environments such as bedding, upholstered furniture and carpeting. . Because so much time is spent in the bedroom, it is essential to reduce mite levels there.  . Encase pillows, mattresses, and box springs in special allergen-proof fabric covers or airtight, zippered plastic covers.  . Bedding should be washed weekly in hot water (130 F) and dried in a hot dryer. Allergen-proof covers are available for comforters and pillows that can't be regularly washed.  Wendee Copp the allergy-proof covers every few months. Minimize clutter in the bedroom. Keep pets out of the bedroom.  Marland Kitchen Keep humidity less than 50% by using a dehumidifier or air conditioning. You can buy a humidity measuring device called a hygrometer to monitor this.  . If possible, replace carpets with hardwood, linoleum, or washable area rugs. If that's not possible, vacuum frequently with a vacuum that has a HEPA filter. . Remove all upholstered furniture and non-washable window drapes from the bedroom. . Remove all non-washable stuffed toys from the bedroom.  Wash stuffed toys weekly. Pet Allergen Avoidance: . Contrary to popular opinion, there are no "hypoallergenic" breeds of dogs or cats. That is because people are not allergic to an  animal's hair, but to an allergen found in the animal's saliva, dander (dead skin flakes) or urine. Pet allergy symptoms typically occur within minutes. For some people, symptoms can build up and become most severe 8 to 12 hours after contact with the animal. People with severe allergies can experience reactions in public places if dander has been transported on the pet owners' clothing. Marland Kitchen Keeping an animal outdoors is only a partial solution, since homes with pets in the yard still have higher concentrations of animal allergens. . Before getting a pet, ask your allergist to determine if you are allergic to animals. If your pet is already considered part of your family, try to minimize contact and keep the pet out of the bedroom and other rooms where you spend a great deal of time. . As with dust mites, vacuum carpets often or replace carpet with a hardwood floor, tile or linoleum. . High-efficiency particulate air (HEPA) cleaners can reduce allergen levels over time. . While dander and saliva are the source of cat and dog allergens, urine is the source of allergens from rabbits, hamsters, mice and [place] pigs; so ask a non-allergic family member to clean the animal's cage. . If you have a pet allergy, talk to your allergist  about the potential for allergy immunotherapy (allergy shots). This strategy can often provide long-term relief.

## 2019-03-31 NOTE — Progress Notes (Signed)
New Patient Note  RE: Stacey Mayer MRN: [id number] DOB: 04-Nov-2000 Date of Office Visit: 03/31/2019  Referring provider: Sandford Craze, NP Primary care provider: Esperanza Richters, PA-C  Chief Complaint: Urticaria (red raised and itchy, last outbreak, 1-1/2 weeks ago, uses tide, soap uses dove)  History of Present Illness: I had the pleasure of seeing Stacey Mayer for initial evaluation at the [facility] on 04/01/2019. She is a 18 y.o. female, who is referred here by Esperanza Richters, PA-C for the evaluation of hives.   Rash: Rash started about 1 month ago. Patient went to bed itchy and then woke up with rashes the following the day. This could occur anywhere on her body. Describes them as red, itchy, raised. Individual rashes lasts about a few hours. No ecchymosis upon resolution. Associated symptoms include: none. Suspected triggers are unknown. This would occur mainly at night and a few times a week. Last episode was last night. Symptoms slightly improved.  Denies any fevers, chills, changes in medications, foods, personal care products or recent infections. She has tried the following therapies: zyrtec with some benefit. Systemic steroids yes with good benefit. Currently on no medications.  Previous work up includes: none. Previous history of rash/hives: denies. No recent tick bites.   Assessment and Plan: Stacey Mayer is a 18 y.o. female with: Urticaria 1 month history of raised, pruritic, erythematous rash lasting a few hours at a time. No triggers noted. Denies any changes in medications, diet or personal care products. Prednisone taper worked. Tried zyrtec with some benefit.  Today's skin testing showed: Positive to grass, trees, mold, dust mites and cat. Mildly positive to fish and shrimp.   Based on clinical history, she most likely is breaking out in hives.   The environmental allergens may be worsening her symptoms and discussed environmental control  measures.  Regarding the foods, patient does not consume seafood/shellfish due to personal preference. I do not believe these are causing her current rashes. In the future, will consider getting bloodwork for seafood/shellfish panel.   Take zyrtec (ceterizine)  daily at night and may take it twice a day if you have breakthrough symptoms.  Start proper skin care.  If above regimen does not control symptoms then will get bloodwork at next visit.   Other allergic rhinitis Mild rhino conjunctivitis symptoms during the spring. Takes benadryl prn with good benefit.  Today's skin testing was positive to grass, trees, mold, dust mites and cat.  Start environmental control measures.  The zyrtec should also help with her allergic rhino conjunctivitis symptoms.   Return in about 4 weeks (around 04/28/2019).  Other allergy screening: Asthma: no Rhino conjunctivitis: yes  Watery eyes and nasal congestion in the spring for which she took benadryl with good benefit.  Food allergy: no  Dietary History: patient has been eating other foods including milk, eggs, peanut, treenuts, sesame, shellfish, seafood, limited soy, wheat, meats, fruits and vegetables. Avoiding seafood/shellfish due to personal reasons.   Medication allergy: no Hymenoptera allergy: no Eczema:no History of recurrent infections suggestive of immunodeficency: no  Diagnostics: Skin Testing: Environmental allergy panel and select foods. Positive test to: grass, trees, mold, dust mites and cat. Mildly positive to fish mix and shrimp.  Results discussed with patient/family. Pediatric Percutaneous Testing - 03/31/19 1430    Time Antigen Placed  1431    Allergen Manufacturer  Waynette Buttery    Location  Back    Number of Test  31    Pediatric Panel  Foods  1. Control-buffer 50% Glycerol  Negative    2. Control-Histamine1mg /ml  2+    3. Peanut  Negative    4. Soy bean food  Negative    5. Wheat, whole  Negative    6. Sesame   Negative    7. Milk, cow  Negative    8. Egg white, chicken  Negative    9. Casein  Negative    10. Cashew  Negative    11. Pecan   Negative    12. Wellman  Negative    13. Shellfish  Negative    14. Shrimp  --   +/-   15. Fish Mix  --   2x2   16. Flounder  Negative    17. Pork  Negative    18. [place] Meat  Negative    19. Beef  Negative    20. Lamb  Negative    21. Chicken Meat  Negative    22. Rice  Negative    23. White Potato  Negative    24. Tomato  Negative    25. Orange  Negative    26. Banana  Negative    27. Apple  Negative    28. Peach  Negative    29. Potato, Sweet  Negative    30. Pea, Green/English  Negative    31. Corn   Negative     Airborne Adult Perc - 03/31/19 1431    Time Antigen Placed  1431    Allergen Manufacturer  Lavella Hammock    Location  Back    Number of Test  59    1. Control-Buffer 50% Glycerol  Negative    2. Control-Histamine 1 mg/ml  2+    3. Albumin saline  Negative    4. [place]  Negative    5. [place]  2+    6. [place]  --   +/-   7. [place] Blue  Negative    8. Meadow Fescue  Negative    9. Perennial Rye  Negative    10. Sweet Vernal  Negative    11. Timothy  Negative    12. Cocklebur  Negative    13. Burweed Marshelder  Negative    14. Ragweed, short  Negative    15. Ragweed, Giant  Negative    16. Plantain,  English  Negative    17. Lamb's Quarters  Negative    18. Sheep Sorrell  Negative    19. Rough Pigweed  Negative    20. Marsh Elder, Rough  Negative    21. Mugwort, Common  Negative    22. Ash mix  Negative    23. Birch mix  Negative    24. Beech American  Negative    25. Box, Elder  Negative    26. Cedar, red  Negative    27. Cottonwood, [place]  Negative    28. Elm mix  Negative    29. Hickory mix  Negative    30. Maple mix  Negative    31. Oak, [place] mix  2+    32. Pecan Pollen  Negative    33. Pine mix  Negative    34. Sycamore Eastern  Negative    35. [place], Black Pollen  Negative    36. Alternaria alternata   Negative    37. Cladosporium Herbarum  Negative    38. Aspergillus mix  Negative    39. Penicillium mix  Negative    40. Bipolaris sorokiniana (Helminthosporium)  Negative  41. Drechslera spicifera (Curvularia)  Negative    42. Mucor plumbeus  Negative    43. Fusarium moniliforme  2+    44. Aureobasidium pullulans (pullulara)  Negative    45. Rhizopus oryzae  Negative    46. Botrytis cinera  Negative    47. Epicoccum nigrum  --   +/-   48. Phoma betae  Negative    49. Candida Albicans  Negative    50. Trichophyton mentagrophytes  Negative    51. Mite, D Farinae  5,000 AU/ml  2+    52. Mite, D Pteronyssinus  5,000 AU/ml  Negative    53. Cat Hair 10,000 BAU/ml  4+    54.  Dog Epithelia  Negative    55. Mixed Feathers  Negative    56. Horse Epithelia  Negative    57. Cockroach, German  Negative    58. Mouse  Negative    59. Tobacco Leaf  Negative       Past Medical History: Patient Active Problem List   Diagnosis Date Noted  . Urticaria 04/01/2019  . Other allergic rhinitis 04/01/2019  . OCP (oral contraceptive pills) initiation 10/03/2016   Past Medical History:  Diagnosis Date  . Allergy    in past mild spring allergies.   Past Surgical History: History reviewed. No pertinent surgical history. Medication List:  No current outpatient medications on file.   No current facility-administered medications for this visit.    Allergies: No Known Allergies Social History: Social History   Socioeconomic History  . Marital status: Single    Spouse name: Not on file  . Number of children: Not on file  . Years of education: Not on file  . Highest education level: Not on file  Occupational History  . Not on file  Social Needs  . Financial resource strain: Not on file  . Food insecurity    Worry: Not on file    Inability: Not on file  . Transportation needs    Medical: Not on file    Non-medical: Not on file  Tobacco Use  . Smoking status: Never Smoker  .  Smokeless tobacco: Never Used  Substance and Sexual Activity  . Alcohol use: No  . Drug use: No  . Sexual activity: Yes    Birth control/protection: Condom  Lifestyle  . Physical activity    Days per week: Not on file    Minutes per session: Not on file  . Stress: Not on file  Relationships  . Social Musicianconnections    Talks on phone: Not on file    Gets together: Not on file    Attends religious service: Not on file    Active member of club or organization: Not on file    Attends meetings of clubs or organizations: Not on file    Relationship status: Not on file  Other Topics Concern  . Not on file  Social History Narrative  . Not on file   Lives in a 20 year old house. Smoking: denies Occupation: brand Development worker, international aidrepresentative  Environmental History: Water Damage/mildew in the house: no Carpet in the family room: no Carpet in the bedroom: yes Heating: gas Cooling: central Pet: no  Family History: History reviewed. No pertinent family history. Problem                               Relation Asthma  No  Eczema                                No  Food allergy                          No  Allergic rhino conjunctivitis     No   Review of Systems  Constitutional: Negative for appetite change, chills, fever and unexpected weight change.  HENT: Negative for congestion and rhinorrhea.   Eyes: Negative for itching.  Respiratory: Negative for cough, chest tightness, shortness of breath and wheezing.   Cardiovascular: Negative for chest pain.  Gastrointestinal: Negative for abdominal pain.  Genitourinary: Negative for difficulty urinating.  Skin: Positive for rash.  Allergic/Immunologic: Positive for environmental allergies.  Neurological: Negative for headaches.   Objective: BP 100/62 (BP Location: Left Arm, Patient Position: Sitting, Cuff Size: Normal)   Pulse 68   Temp 97.6 F (36.4 C) (Temporal)   Resp 16   Ht 5' 2.21" (1.58 m)   Wt 105 lb  (47.6 kg)   SpO2 100%   BMI 19.08 kg/m  Body mass index is 19.08 kg/m. Physical Exam  Constitutional: She is oriented to person, place, and time. She appears well-developed and well-nourished.  HENT:  Head: Normocephalic and atraumatic.  Right Ear: External ear normal.  Left Ear: External ear normal.  Nose: Nose normal.  Mouth/Throat: Oropharynx is clear and moist.  Eyes: Conjunctivae and EOM are normal.  Neck: Neck supple.  Cardiovascular: Normal rate, regular rhythm and normal heart sounds. Exam reveals no gallop and no friction rub.  No murmur heard. Pulmonary/Chest: Effort normal and breath sounds normal. She has no wheezes. She has no rales.  Abdominal: Soft.  Neurological: She is alert and oriented to person, place, and time.  Skin: Skin is warm. Rash noted.  Faint erythematous patch on left forearm  Psychiatric: She has a normal mood and affect. Her behavior is normal.  Nursing note and vitals reviewed.  The plan was reviewed with the patient/family, and all questions/concerned were addressed.  It was my pleasure to see Korah today and participate in her care. Please feel free to contact me with any questions or concerns.  Sincerely,  Wyline Mood, DO Allergy & Immunology  [facility] office: [phone] [facility] office: [phone] [place] office: [phone]

## 2019-04-01 ENCOUNTER — Encounter: Payer: Self-pay | Admitting: Allergy

## 2019-04-01 DIAGNOSIS — L509 Urticaria, unspecified: Secondary | ICD-10-CM | POA: Insufficient documentation

## 2019-04-01 DIAGNOSIS — J3089 Other allergic rhinitis: Secondary | ICD-10-CM | POA: Insufficient documentation

## 2019-04-01 NOTE — Assessment & Plan Note (Addendum)
1 month history of raised, pruritic, erythematous rash lasting a few hours at a time. No triggers noted. Denies any changes in medications, diet or personal care products. Prednisone taper worked. Tried zyrtec with some benefit.  Today's skin testing showed: Positive to grass, trees, mold, dust mites and cat. Mildly positive to fish and shrimp.   Based on clinical history, she most likely is breaking out in hives.   The environmental allergens may be worsening her symptoms and discussed environmental control measures.  Regarding the foods, patient does not consume seafood/shellfish due to personal preference. I do not believe these are causing her current rashes. In the future, will consider getting bloodwork for seafood/shellfish panel.   Take zyrtec (ceterizine) 10mg  daily at night and may take it twice a day if you have breakthrough symptoms.  Start proper skin care.  If above regimen does not control symptoms then will get bloodwork at next visit.

## 2019-04-01 NOTE — Assessment & Plan Note (Addendum)
Mild rhino conjunctivitis symptoms during the spring. Takes benadryl prn with good benefit.  Today's skin testing was positive to grass, trees, mold, dust mites and cat.  Start environmental control measures.  The zyrtec should also help with her allergic rhino conjunctivitis symptoms.

## 2019-06-29 ENCOUNTER — Ambulatory Visit: Payer: BC Managed Care – PPO | Admitting: Advanced Practice Midwife

## 2019-09-23 ENCOUNTER — Ambulatory Visit: Payer: BC Managed Care – PPO

## 2019-09-24 ENCOUNTER — Ambulatory Visit: Payer: BC Managed Care – PPO

## 2019-09-24 ENCOUNTER — Ambulatory Visit: Payer: BC Managed Care – PPO | Attending: Internal Medicine

## 2019-09-24 DIAGNOSIS — Z23 Encounter for immunization: Secondary | ICD-10-CM

## 2019-09-24 NOTE — Progress Notes (Signed)
   Covid-19 Vaccination Clinic  Name:  Oletha Tolson    MRN: [id number] DOB: 2001/06/01  09/24/2019  Ms. Briley was observed post Covid-19 immunization for 15 minutes without incident. She was provided with Vaccine Information Sheet and instruction to access the V-Safe system.   Ms. Erck was instructed to call 911 with any severe reactions post vaccine: Marland Kitchen Difficulty breathing  . Swelling of face and throat  . A fast heartbeat  . A bad rash all over body  . Dizziness and weakness   Immunizations Administered    Name Date Dose VIS Date Route   Pfizer COVID-19 Vaccine 09/24/2019  2:51 PM 0.3 mL 05/28/2019 Intramuscular   Manufacturer: [employer], [employer]   Lot: [id number]   NDC: [address]-4

## 2019-10-18 ENCOUNTER — Ambulatory Visit: Payer: BC Managed Care – PPO | Attending: Internal Medicine

## 2019-10-18 DIAGNOSIS — Z23 Encounter for immunization: Secondary | ICD-10-CM

## 2019-10-18 NOTE — Progress Notes (Signed)
   Covid-19 Vaccination Clinic  Name:  Stacey Mayer    MRN: [id number] DOB: 12-Aug-2000  10/18/2019  [name] was observed post Covid-19 immunization for 15 minutes without incident. She was provided with Vaccine Information Sheet and instruction to access the V-Safe system.   [name] was instructed to call 911 with any severe reactions post vaccine: Marland Kitchen Difficulty breathing  . Swelling of face and throat  . A fast heartbeat  . A bad rash all over body  . Dizziness and weakness   Immunizations Administered    Name Date Dose VIS Date Route   Pfizer COVID-19 Vaccine 10/18/2019  3:16 PM 0.3 mL 08/11/2018 Intramuscular   Manufacturer: [employer], [employer]   Lot: [id number]   NDC: [address]-6

## 2019-10-19 ENCOUNTER — Ambulatory Visit: Payer: BC Managed Care – PPO

## 2019-12-06 ENCOUNTER — Encounter: Payer: Self-pay | Admitting: Family Medicine

## 2019-12-06 ENCOUNTER — Ambulatory Visit (INDEPENDENT_AMBULATORY_CARE_PROVIDER_SITE_OTHER): Payer: BC Managed Care – PPO | Admitting: Family Medicine

## 2019-12-06 ENCOUNTER — Other Ambulatory Visit (HOSPITAL_COMMUNITY)
Admission: RE | Admit: 2019-12-06 | Discharge: 2019-12-06 | Disposition: A | Discharge status: Home / Self Care | Payer: BC Managed Care – PPO | Source: Ambulatory Visit | Attending: Family Medicine | Admitting: Family Medicine

## 2019-12-06 ENCOUNTER — Other Ambulatory Visit: Payer: Self-pay

## 2019-12-06 VITALS — BP 104/58 | HR 66 | Ht 62.0 in | Wt 110.0 lb

## 2019-12-06 DIAGNOSIS — Z113 Encounter for screening for infections with a predominantly sexual mode of transmission: Secondary | ICD-10-CM

## 2019-12-06 DIAGNOSIS — Z01419 Encounter for gynecological examination (general) (routine) without abnormal findings: Secondary | ICD-10-CM | POA: Insufficient documentation

## 2019-12-06 NOTE — Progress Notes (Signed)
GYNECOLOGY ANNUAL PREVENTATIVE CARE ENCOUNTER NOTE  Subjective:   Stacey Mayer is a 19 y.o. G0P0000 female here for a routine annual gynecologic exam.  Current complaints: none. Menses every 28-30 days, 6-7 days long, cramping first couple of days.   Denies abnormal vaginal bleeding, discharge, pelvic pain, problems with intercourse or other gynecologic concerns.    Gynecologic History Patient's last menstrual period was 11/15/2019. Patient is not currently sexually active, but heterosexual partners Contraception: none Last Pap: n/a Last mammogram: n/a.  Obstetric History OB History  Gravida Para Term Preterm AB Living  0 0 0 0 0 0  SAB TAB Ectopic Multiple Live Births  0 0 0 0 0    Past Medical History:  Diagnosis Date  . Allergy    in past mild spring allergies.    No past surgical history on file.  No current outpatient medications on file prior to visit.   No current facility-administered medications on file prior to visit.    No Known Allergies  Social History   Socioeconomic History  . Marital status: Single    Spouse name: Not on file  . Number of children: Not on file  . Years of education: Not on file  . Highest education level: Not on file  Occupational History  . Not on file  Tobacco Use  . Smoking status: Never Smoker  . Smokeless tobacco: Never Used  Vaping Use  . Vaping Use: Never used  Substance and Sexual Activity  . Alcohol use: No  . Drug use: No  . Sexual activity: Yes    Birth control/protection: Condom  Other Topics Concern  . Not on file  Social History Narrative  . Not on file   Social Determinants of Health   Financial Resource Strain:   . Difficulty of Paying Living Expenses:   Food Insecurity:   . Worried About Programme researcher, broadcasting/film/video in the Last Year:   . Barista in the Last Year:   Transportation Needs:   . Freight forwarder (Medical):   Marland Kitchen Lack of Transportation (Non-Medical):   Physical Activity:   .  Days of Exercise per Week:   . Minutes of Exercise per Session:   Stress:   . Feeling of Stress :   Social Connections:   . Frequency of Communication with Friends and Family:   . Frequency of Social Gatherings with Friends and Family:   . Attends Religious Services:   . Active Member of Clubs or Organizations:   . Attends Banker Meetings:   Marland Kitchen Marital Status:   Intimate Partner Violence:   . Fear of Current or Ex-Partner:   . Emotionally Abused:   Marland Kitchen Physically Abused:   . Sexually Abused:     No family history on file.  The following portions of the patient's history were reviewed and updated as appropriate: allergies, current medications, past family history, past medical history, past social history, past surgical history and problem list.  Review of Systems Pertinent items are noted in HPI.   Objective:  BP (!) 104/58   Pulse 66   Ht 5\' 2"  (1.575 m)   Wt 110 lb (49.9 kg)   LMP 11/15/2019   BMI 20.12 kg/m  Wt Readings from Last 3 Encounters:  12/06/19 110 lb (49.9 kg)  03/31/19 105 lb (47.6 kg)  03/24/19 110 lb 9.6 oz (50.2 kg)     Chaperone present during exam  CONSTITUTIONAL: Well-developed, well-nourished female in no acute  distress.  HENT:  Normocephalic, atraumatic, External right and left ear normal. Oropharynx is clear and moist EYES: Conjunctivae and EOM are normal. Pupils are equal, round, and reactive to light. No scleral icterus.  NECK: Normal range of motion, supple, no masses.  Normal thyroid.   CARDIOVASCULAR: Normal heart rate noted, regular rhythm RESPIRATORY: Clear to auscultation bilaterally. Effort and breath sounds normal, no problems with respiration noted. BREASTS: declined ABDOMEN: Soft, normal bowel sounds, no distention noted.  No tenderness, rebound or guarding.  PELVIC: Normal appearing external genitalia; normal appearing vaginal mucosa and cervix.  No abnormal discharge noted.  MUSCULOSKELETAL: Normal range of motion. No  tenderness.  No cyanosis, clubbing, or edema.  2+ distal pulses. SKIN: Skin is warm and dry. No rash noted. Not diaphoretic. No erythema. No pallor. NEUROLOGIC: Alert and oriented to person, place, and time. Normal reflexes, muscle tone coordination. No cranial nerve deficit noted. PSYCHIATRIC: Normal mood and affect. Normal behavior. Normal judgment and thought content.  Assessment:  Annual gynecologic examination with pap smear   Plan:  1. Well Woman Exam Will follow up results of pap smear and manage accordingly. STD testing discussed. Patient requested testing - Cytology - PAP( [facility])   Routine preventative health maintenance measures emphasized. Please refer to After Visit Summary for other counseling recommendations.    Loma Boston, [place] for [employer]

## 2019-12-07 LAB — HEPATITIS B SURFACE ANTIGEN: Hepatitis B Surface Ag: NEGATIVE

## 2019-12-07 LAB — HEPATITIS C ANTIBODY: Hep C Virus Ab: <0.1 s/co ratio (ref 0.0–0.9)

## 2019-12-07 LAB — RPR: RPR Ser Ql: NONREACTIVE

## 2019-12-07 LAB — HIV ANTIBODY (ROUTINE TESTING W REFLEX): HIV Screen 4th Generation wRfx: NONREACTIVE

## 2019-12-08 LAB — CYTOLOGY - PAP
Chlamydia: NEGATIVE
Comment: NEGATIVE
Comment: NORMAL
Diagnosis: NEGATIVE
Neisseria Gonorrhea: NEGATIVE

## 2020-05-19 ENCOUNTER — Ambulatory Visit: Payer: BC Managed Care – PPO | Admitting: Family Medicine

## 2020-05-31 ENCOUNTER — Other Ambulatory Visit (HOSPITAL_COMMUNITY)
Admission: RE | Admit: 2020-05-31 | Discharge: 2020-05-31 | Disposition: A | Discharge status: Home / Self Care | Payer: BC Managed Care – PPO | Source: Ambulatory Visit | Attending: Family Medicine | Admitting: Family Medicine

## 2020-05-31 ENCOUNTER — Ambulatory Visit (INDEPENDENT_AMBULATORY_CARE_PROVIDER_SITE_OTHER): Payer: BC Managed Care – PPO | Admitting: Family Medicine

## 2020-05-31 ENCOUNTER — Encounter: Payer: Self-pay | Admitting: Family Medicine

## 2020-05-31 ENCOUNTER — Other Ambulatory Visit: Payer: Self-pay

## 2020-05-31 VITALS — BP 112/64 | HR 64 | Wt 114.0 lb

## 2020-05-31 DIAGNOSIS — Z113 Encounter for screening for infections with a predominantly sexual mode of transmission: Secondary | ICD-10-CM | POA: Diagnosis not present

## 2020-05-31 DIAGNOSIS — Z30011 Encounter for initial prescription of contraceptive pills: Secondary | ICD-10-CM

## 2020-05-31 DIAGNOSIS — N898 Other specified noninflammatory disorders of vagina: Secondary | ICD-10-CM | POA: Diagnosis present

## 2020-05-31 MED ORDER — LO LOESTRIN FE 1 MG-10 MCG / 10 MCG PO TABS: 1.0000 | ORAL_TABLET | Freq: Every day | ORAL | 3 refills | Status: DC

## 2020-05-31 NOTE — Progress Notes (Signed)
   Subjective:    Patient ID: Stacey Mayer, female    DOB: 24-Dec-2000, 19 y.o.   MRN: [id number]  HPI Has new partner. Would like to be tested for STDs. Has some yellowish discharge without odor.   Would also like to start OCPs. Was on lo loestrin before and did well on it. No headaches, fh/o DVE.   Review of Systems     Objective:   Physical Exam Vitals reviewed. Exam conducted with a chaperone present.  Constitutional:      Appearance: Normal appearance.  Genitourinary:    Labia:        Right: No rash, tenderness or lesion.        Left: No rash, tenderness or lesion.      Urethra: No prolapse, urethral pain, urethral swelling or urethral lesion.  Skin:    Capillary Refill: Capillary refill takes less than 2 seconds.  Neurological:     General: No focal deficit present.     Mental Status: She is alert.  Psychiatric:        Mood and Affect: Mood normal.        Behavior: Behavior normal.        Thought Content: Thought content normal.        Judgment: Judgment normal.        Assessment & Plan:  1. Routine screening for STI (sexually transmitted infection) - HIV antibody (with reflex) - RPR - Hepatitis C Antibody - Hepatitis B Surface AntiGEN  2. Vaginal discharge - Cervicovaginal ancillary only( [place])  3. Birth control Restart Lo Loestrin. Discussed common side effects.

## 2020-06-01 LAB — RPR: RPR Ser Ql: NONREACTIVE

## 2020-06-01 LAB — HEPATITIS C ANTIBODY: Hep C Virus Ab: <0.1 s/co ratio (ref 0.0–0.9)

## 2020-06-01 LAB — HIV ANTIBODY (ROUTINE TESTING W REFLEX): HIV Screen 4th Generation wRfx: NONREACTIVE

## 2020-06-01 LAB — HEPATITIS B SURFACE ANTIGEN: Hepatitis B Surface Ag: NEGATIVE

## 2020-06-02 LAB — CERVICOVAGINAL ANCILLARY ONLY
Bacterial Vaginitis (gardnerella): NEGATIVE
Candida Glabrata: NEGATIVE
Candida Vaginitis: NEGATIVE
Chlamydia: NEGATIVE
Comment: NEGATIVE
Comment: NEGATIVE
Comment: NEGATIVE
Comment: NEGATIVE
Comment: NEGATIVE
Comment: NORMAL
Neisseria Gonorrhea: NEGATIVE
Trichomonas: NEGATIVE

## 2021-01-04 ENCOUNTER — Ambulatory Visit: Payer: BC Managed Care – PPO | Admitting: Family Medicine

## 2021-02-09 ENCOUNTER — Ambulatory Visit: Payer: BC Managed Care – PPO | Admitting: Family Medicine

## 2021-04-05 ENCOUNTER — Other Ambulatory Visit (HOSPITAL_COMMUNITY)
Admission: RE | Admit: 2021-04-05 | Discharge: 2021-04-05 | Disposition: A | Discharge status: Home / Self Care | Payer: BC Managed Care – PPO | Source: Ambulatory Visit | Attending: Family Medicine | Admitting: Family Medicine

## 2021-04-05 ENCOUNTER — Encounter: Payer: Self-pay | Admitting: Family Medicine

## 2021-04-05 ENCOUNTER — Ambulatory Visit (INDEPENDENT_AMBULATORY_CARE_PROVIDER_SITE_OTHER): Payer: BC Managed Care – PPO | Admitting: Family Medicine

## 2021-04-05 ENCOUNTER — Other Ambulatory Visit: Payer: Self-pay

## 2021-04-05 VITALS — BP 114/71 | HR 57 | Ht 62.0 in | Wt 114.0 lb

## 2021-04-05 DIAGNOSIS — Z7721 Contact with and (suspected) exposure to potentially hazardous body fluids: Secondary | ICD-10-CM | POA: Diagnosis not present

## 2021-04-05 DIAGNOSIS — Z01419 Encounter for gynecological examination (general) (routine) without abnormal findings: Secondary | ICD-10-CM

## 2021-04-05 NOTE — Progress Notes (Signed)
GYNECOLOGY ANNUAL PREVENTATIVE CARE ENCOUNTER NOTE  Subjective:   Stacey Mayer is a 20 y.o. G0P0000 female here for a routine annual gynecologic exam.  Current complaints: none.   Denies abnormal vaginal bleeding, discharge, pelvic pain, problems with intercourse or other gynecologic concerns.    Gynecologic History Patient's last menstrual period was 04/03/2021. Patient is sexually active  Contraception: condoms Last Pap: 11/2019. Results were: normal Last mammogram: n/a. Colorectal Cancer Screening: n/a  Obstetric History OB History  Gravida Para Term Preterm AB Living  0 0 0 0 0 0  SAB IAB Ectopic Multiple Live Births  0 0 0 0 0    Past Medical History:  Diagnosis Date   Allergy    in past mild spring allergies.    No past surgical history on file.  Current Outpatient Medications on File Prior to Visit  Medication Sig Dispense Refill   LO LOESTRIN FE 1 MG-10 MCG / 10 MCG tablet Take 1 tablet by mouth daily. (Patient not taking: Reported on 04/05/2021) 84 tablet 3   No current facility-administered medications on file prior to visit.    No Known Allergies  Social History   Socioeconomic History   Marital status: Single    Spouse name: Not on file   Number of children: Not on file   Years of education: Not on file   Highest education level: Not on file  Occupational History   Not on file  Tobacco Use   Smoking status: Never   Smokeless tobacco: Never  Vaping Use   Vaping Use: Never used  Substance and Sexual Activity   Alcohol use: No   Drug use: No   Sexual activity: Yes    Birth control/protection: Condom  Other Topics Concern   Not on file  Social History Narrative   Not on file   Social Determinants of Health   Financial Resource Strain: Not on file  Food Insecurity: Not on file  Transportation Needs: Not on file  Physical Activity: Not on file  Stress: Not on file  Social Connections: Not on file  Intimate Partner Violence: Not on file     No family history on file.  The following portions of the patient's history were reviewed and updated as appropriate: allergies, current medications, past family history, past medical history, past social history, past surgical history and problem list.  Review of Systems Pertinent items are noted in HPI.   Objective:  BP 114/71   Pulse (!) 57   Ht 5\' 2"  (1.575 m)   Wt 114 lb (51.7 kg)   LMP 04/03/2021   BMI 20.85 kg/m  Wt Readings from Last 3 Encounters:  04/05/21 114 lb (51.7 kg)  05/31/20 114 lb (51.7 kg)  12/06/19 110 lb (49.9 kg)     Chaperone present during exam  CONSTITUTIONAL: Well-developed, well-nourished female in no acute distress.  HENT:  Normocephalic, atraumatic, External right and left ear normal. Oropharynx is clear and moist EYES: Conjunctivae and EOM are normal. Pupils are equal, round, and reactive to light. No scleral icterus.  NECK: Normal range of motion, supple, no masses.  Normal thyroid.   CARDIOVASCULAR: Normal heart rate noted, regular rhythm RESPIRATORY: Clear to auscultation bilaterally. Effort and breath sounds normal, no problems with respiration noted. BREASTS: patient declined exam. ABDOMEN: Soft, normal bowel sounds, no distention noted.  No tenderness, rebound or guarding.  PELVIC: Normal appearing external genitalia; normal appearing vaginal mucosa and cervix.  No abnormal discharge noted.   MUSCULOSKELETAL: Normal  range of motion. No tenderness.  No cyanosis, clubbing, or edema.  2+ distal pulses. SKIN: Skin is warm and dry. No rash noted. Not diaphoretic. No erythema. No pallor. NEUROLOGIC: Alert and oriented to person, place, and time. Normal reflexes, muscle tone coordination. No cranial nerve deficit noted. PSYCHIATRIC: Normal mood and affect. Normal behavior. Normal judgment and thought content.  Assessment:  Annual gynecologic examination with pap smear   Plan:  1. Well Woman Exam Will follow up results of pap smear and  manage accordingly. STD testing discussed. Patient requested testing  2. Exposure to body fluid STD screening   Routine preventative health maintenance measures emphasized. Please refer to After Visit Summary for other counseling recommendations.    Candelaria Celeste, DO [facility] [employer]

## 2021-04-06 LAB — RPR: RPR Ser Ql: NONREACTIVE

## 2021-04-06 LAB — HIV ANTIBODY (ROUTINE TESTING W REFLEX): HIV Screen 4th Generation wRfx: NONREACTIVE

## 2021-04-06 LAB — HEPATITIS B SURFACE ANTIGEN: Hepatitis B Surface Ag: NEGATIVE

## 2021-04-06 LAB — CERVICOVAGINAL ANCILLARY ONLY
Chlamydia: NEGATIVE
Comment: NEGATIVE
Comment: NORMAL
Neisseria Gonorrhea: NEGATIVE

## 2021-04-06 LAB — HEPATITIS C ANTIBODY: Hep C Virus Ab: <0.1 s/co ratio (ref 0.0–0.9)

## 2021-05-14 ENCOUNTER — Encounter: Payer: Self-pay | Admitting: Family Medicine

## 2021-06-07 ENCOUNTER — Telehealth: Payer: Self-pay

## 2021-06-07 NOTE — Telephone Encounter (Signed)
Pt states she has brown discharged after finishing her period, wants to know if this is related to the birth control she started. Advised pt the brown discharge is more than likely old blood and she should not be worried at this time

## 2021-07-17 ENCOUNTER — Ambulatory Visit: Payer: BC Managed Care – PPO

## 2021-07-17 ENCOUNTER — Encounter: Payer: Self-pay | Admitting: Family Medicine

## 2021-07-18 ENCOUNTER — Other Ambulatory Visit: Payer: Self-pay

## 2021-07-18 DIAGNOSIS — Z3041 Encounter for surveillance of contraceptive pills: Secondary | ICD-10-CM

## 2021-07-18 MED ORDER — LO LOESTRIN FE 1 MG-10 MCG / 10 MCG PO TABS: 1.0000 | ORAL_TABLET | Freq: Every day | ORAL | 3 refills | Status: DC

## 2021-08-22 ENCOUNTER — Other Ambulatory Visit: Payer: Self-pay

## 2021-08-22 ENCOUNTER — Encounter: Payer: Self-pay | Admitting: General Practice

## 2021-08-22 ENCOUNTER — Ambulatory Visit: Payer: BC Managed Care – PPO | Admitting: Family Medicine

## 2021-08-22 ENCOUNTER — Encounter: Payer: Self-pay | Admitting: Family Medicine

## 2021-08-22 VITALS — BP 118/70 | HR 58 | Wt 105.0 lb

## 2021-08-22 DIAGNOSIS — Z30017 Encounter for initial prescription of implantable subdermal contraceptive: Secondary | ICD-10-CM | POA: Diagnosis not present

## 2021-08-22 LAB — POCT URINE PREGNANCY: Preg Test, Ur: NEGATIVE

## 2021-08-22 MED ORDER — ETONOGESTREL 68 MG ~~LOC~~ IMPL
68.0000 mg | DRUG_IMPLANT | Freq: Once | SUBCUTANEOUS | Status: AC
  Administered 2021-08-22: 68 mg via SUBCUTANEOUS

## 2021-08-22 NOTE — Progress Notes (Signed)
Patient desires nexplanon. In a stable relationship and wants something that she won't forget to take. Discussed potential side effects, breakthrough bleeding/spotting, cramping, etc. Overlap COC for a month to decrease breakthrough bleeding rate. ? ?Nexplanon Insertion: ? ?Patient given informed consent, signed copy in the chart, time out was performed. Pregnancy test was negative. ?Appropriate time out taken.  Patient's left arm was prepped and draped in the usual sterile fashion.. The ruler used to measure and mark insertion area.  Pt was prepped with alcohol swab and then injected with 5 cc of 2% lidocaine with epinephrine.  Pt was prepped with betadine, Implanon removed form packaging,  Device confirmed in needle, then inserted full length of needle and withdrawn per handbook instructions.  Device palpated by physician and patient.  Pt insertion site covered with pressure dressing.   Minimal blood loss.  Pt tolerated the procedure well.  ?

## 2021-11-13 ENCOUNTER — Ambulatory Visit: Payer: BC Managed Care – PPO | Admitting: Advanced Practice Midwife

## 2021-11-13 ENCOUNTER — Encounter: Payer: Self-pay | Admitting: General Practice

## 2021-11-13 ENCOUNTER — Encounter: Payer: Self-pay | Admitting: Advanced Practice Midwife

## 2021-11-13 VITALS — BP 120/63 | HR 59 | Wt 104.0 lb

## 2021-11-13 DIAGNOSIS — N921 Excessive and frequent menstruation with irregular cycle: Secondary | ICD-10-CM

## 2021-11-13 DIAGNOSIS — Z3046 Encounter for surveillance of implantable subdermal contraceptive: Secondary | ICD-10-CM

## 2021-11-13 DIAGNOSIS — Z30011 Encounter for initial prescription of contraceptive pills: Secondary | ICD-10-CM | POA: Diagnosis not present

## 2021-11-13 DIAGNOSIS — Z975 Presence of (intrauterine) contraceptive device: Secondary | ICD-10-CM

## 2021-11-13 MED ORDER — NORETHIN ACE-ETH ESTRAD-FE 1-20 MG-MCG(24) PO TABS: 1.0000 | ORAL_TABLET | Freq: Every day | ORAL | 11 refills | Status: DC

## 2021-11-13 NOTE — Progress Notes (Signed)
GYNECOLOGY ANNUAL PREVENTATIVE CARE ENCOUNTER NOTE  Subjective:   Stacey Mayer is a 21 y.o. G0P0000 female here for removal of her Nexplanon.  She had it inserted in March, "just for a change".  States liked OCPs and wants to restart.  Has had heavy bleeding and does not want to use pills to help that "I just want it out, Ive thought it through".  Denies discharge, pelvic pain, problems with intercourse or other gynecologic concerns.    Gynecologic History No LMP recorded. Contraception: Nexplanon   Obstetric History OB History  Gravida Para Term Preterm AB Living  0 0 0 0 0 0  SAB IAB Ectopic Multiple Live Births  0 0 0 0 0    Past Medical History:  Diagnosis Date   Allergy    in past mild spring allergies.    No past surgical history on file.  No current outpatient medications on file prior to visit.   No current facility-administered medications on file prior to visit.    No Known Allergies  Social History   Socioeconomic History   Marital status: Single    Spouse name: Not on file   Number of children: Not on file   Years of education: Not on file   Highest education level: Not on file  Occupational History   Not on file  Tobacco Use   Smoking status: Never   Smokeless tobacco: Never  Vaping Use   Vaping Use: Never used  Substance and Sexual Activity   Alcohol use: No   Drug use: No   Sexual activity: Yes    Birth control/protection: Condom  Other Topics Concern   Not on file  Social History Narrative   Not on file   Social Determinants of Health   Financial Resource Strain: Not on file  Food Insecurity: Not on file  Transportation Needs: Not on file  Physical Activity: Not on file  Stress: Not on file  Social Connections: Not on file  Intimate Partner Violence: Not on file    The following portions of the patient's history were reviewed and updated as appropriate: allergies, current medications, past family history, past medical history, past  social history, past surgical history and problem list.  Review of Systems Pertinent items noted in HPI and remainder of comprehensive ROS otherwise negative.   Objective:  BP 120/63   Pulse (!) 59   Wt 104 lb (47.2 kg)   BMI 19.02 kg/m  CONSTITUTIONAL: Well-developed, well-nourished female in no acute distress.  SKIN: Skin is warm and dry. No rash noted. Not diaphoretic. No erythema. No pallor. NEUROLOGIC: Alert and oriented  PSYCHIATRIC: Normal mood and affect. Normal behavior. Normal judgment and thought content. CARDIOVASCULAR: Normal heart rate noted RESPIRATORY:  Effort normal.  Nexplanon Removal Consent obtained and Time-Out conducted Implant palpated in left upper arm Betadine prep done on area of excision/removal Lidocaine infiltrated into intradermal and subcutaneous space Small 2mm incision made with scalpel Pressure applied to distal end of implant which exposed the tip through incision Tip of implant grasped with hemostat There was some adherence of implant in subcutaneous tissue.  Twisting and manipulation freed the implant from the capsule Implant removed Pressure held on incision until bleeding stopped Steristrips applied to incision Pressure dressing applied by RN Patient tolerated procedure well.     After leaving office, had a presyncopal episode.  Brought back and placed supine.  Water given AVSS.  Recovered shortly therafter and requested to leave.  Declined transfer to ED.  Assessment:  Removal of Nexplanon Restart OCPs PreSyncopal episode following procedure, resolved   Plan:  Rx Loestrin sent to pharmacy Routine preventative health maintenance measures emphasized. Please refer to After Visit Summary for other counseling recommendations.

## 2021-11-13 NOTE — Progress Notes (Signed)
Pt had a Nexplanon removed at 10:25 am.She left the office at 10:35 am. I went into the hallway to make patient aware that her next appointment will be canceled.Patient stated she felt like she was going to pass out. I ran towards her and she proceeded to sit down on the floor. Pt states she couldn't see. Wynelle Bourgeois CNM. Armandina Stammer, RN, and Andris Flurry, joined me out in the hall way. We offered to send patient to the ED but patient refused stating she has to go to work. We helped patient get off the floor then we took her back into the office and had her sit in an exam room. We checked her vitals, and gave her some water. Her BP was 85/53 P 50. Patient sat in the exam room until 10:45. She left the office at 10;45 am and stated she felt better and did not want any further treatment. Yanuel Tagg l Dyana Magner, CMA

## 2021-11-14 ENCOUNTER — Encounter: Payer: Self-pay | Admitting: Advanced Practice Midwife

## 2021-11-15 ENCOUNTER — Ambulatory Visit: Payer: BC Managed Care – PPO | Admitting: Obstetrics & Gynecology

## 2021-11-22 ENCOUNTER — Ambulatory Visit: Payer: BC Managed Care – PPO | Admitting: Family Medicine

## 2021-12-14 ENCOUNTER — Ambulatory Visit (INDEPENDENT_AMBULATORY_CARE_PROVIDER_SITE_OTHER): Payer: BC Managed Care – PPO | Admitting: Medical

## 2021-12-14 VITALS — BP 107/60 | HR 62 | Temp 98.1°F | Resp 18 | Ht 62.0 in | Wt 107.4 lb

## 2021-12-14 DIAGNOSIS — Z0000 Encounter for general adult medical examination without abnormal findings: Secondary | ICD-10-CM

## 2021-12-14 NOTE — Patient Instructions (Addendum)
For you wellness exam today I have ordered cbc, cmp and lipid panel.  Vaccine declined today  Recommend exercise and healthy diet.  We will let you know lab results as they come in.  Follow up date appointment will be determined after lab review.     Preventive Care 89-23 Years Old, Female Preventive care refers to lifestyle choices and visits with your health care provider that can promote health and wellness. At this stage in your life, you may start seeing a primary care physician instead of a pediatrician for your preventive care. Preventive care visits are also called wellness exams. What can I expect for my preventive care visit? Counseling During your preventive care visit, your health care provider may ask about your: Medical history, including: Past medical problems. Family medical history. Pregnancy history. Current health, including: Menstrual cycle. Method of birth control. Emotional well-being. Home life and relationship well-being. Sexual activity and sexual health. Lifestyle, including: Alcohol, nicotine or tobacco, and drug use. Access to firearms. Diet, exercise, and sleep habits. Sunscreen use. Motor vehicle safety. Physical exam Your health care provider may check your: Height and weight. These may be used to calculate your BMI (body mass index). BMI is a measurement that tells if you are at a healthy weight. Waist circumference. This measures the distance around your waistline. This measurement also tells if you are at a healthy weight and may help predict your risk of certain diseases, such as type 2 diabetes and high blood pressure. Heart rate and blood pressure. Body temperature. Skin for abnormal spots. Breasts. What immunizations do I need?  Vaccines are usually given at various ages, according to a schedule. Your health care provider will recommend vaccines for you based on your age, medical history, and lifestyle or other factors, such as travel or  where you work. What tests do I need? Screening Your health care provider may recommend screening tests for certain conditions. This may include: Vision and hearing tests. Lipid and cholesterol levels. Pelvic exam and Pap test. Hepatitis B test. Hepatitis C test. HIV (human immunodeficiency virus) test. STI (sexually transmitted infection) testing, if you are at risk. Tuberculosis skin test if you have symptoms. BRCA-related cancer screening. This may be done if you have a family history of breast, ovarian, tubal, or peritoneal cancers. Talk with your health care provider about your test results, treatment options, and if necessary, the need for more tests. Follow these instructions at home: Eating and drinking  Eat a healthy diet that includes fresh fruits and vegetables, whole grains, lean protein, and low-fat dairy products. Drink enough fluid to keep your urine pale yellow. Do not drink alcohol if: Your health care provider tells you not to drink. You are pregnant, may be pregnant, or are planning to become pregnant. You are under the legal drinking age. In the [place] the legal drinking age is 57. If you drink alcohol: Limit how much you have to 0-1 drink a day. Know how much alcohol is in your drink. In the [place] one drink equals one 12 oz bottle of beer (355 mL), one 5 oz glass of wine (148 mL), or one 1 oz glass of hard liquor (44 mL). Lifestyle Brush your teeth every morning and night with fluoride toothpaste. Floss one time each day. Exercise for at least 30 minutes 5 or more days of the week. Do not use any products that contain nicotine or tobacco. These products include cigarettes, chewing tobacco, and vaping devices, such as e-cigarettes. If you need  help quitting, ask your health care provider. Do not use drugs. If you are sexually active, practice safe sex. Use a condom or other form of protection to prevent STIs. If you do not wish to become pregnant, use a form of  birth control. If you plan to become pregnant, see your health care provider for a prepregnancy visit. Find healthy ways to manage stress, such as: Meditation, yoga, or listening to music. Journaling. Talking to a trusted person. Spending time with friends and family. Safety Always wear your seat belt while driving or riding in a vehicle. Do not drive: If you have been drinking alcohol. Do not ride with someone who has been drinking. When you are tired or distracted. While texting. If you have been using any mind-altering substances or drugs. Wear a helmet and other protective equipment during sports activities. If you have firearms in your house, make sure you follow all gun safety procedures. Seek help if you have been bullied, physically abused, or sexually abused. Use the internet responsibly to avoid dangers, such as online bullying and online sex predators. What's next? Go to your health care provider once a year for an annual wellness visit. Ask your health care provider how often you should have your eyes and teeth checked. Stay up to date on all vaccines. This information is not intended to replace advice given to you by your health care provider. Make sure you discuss any questions you have with your health care provider. Document Revised: 11/29/2020 Document Reviewed: 11/29/2020 Elsevier Patient Education  [place].

## 2021-12-14 NOTE — Progress Notes (Signed)
Subjective:    Patient ID: Stacey Mayer, female    DOB: [birth year], 21 y.o.   MRN: [id number]  HPI  Pt in for cpe/wellness.   Since last seen graduated from [employer]. 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  Pt has been trying to work out at [employer] 3 days a week. Moderately successful on healthy diet. Non smoker. Rare social alcohol use.  LMP- one month ago. Is on birth control. On loestrin.  Declines tdap and hpv vaccine.  Up to date on pap.  Review of Systems  Constitutional:  Negative for chills, fatigue and unexpected weight change.  HENT:  Negative for congestion and ear pain.   Respiratory:  Negative for cough, chest tightness, shortness of breath and wheezing.   Cardiovascular:  Negative for chest pain.  Gastrointestinal:  Negative for abdominal pain, constipation and nausea.  Genitourinary:  Negative for frequency.  Musculoskeletal:  Negative for back pain and myalgias.  Skin:  Negative for rash.  Neurological:  Negative for dizziness and headaches.  Hematological:  Negative for adenopathy. Does not bruise/bleed easily.  Psychiatric/Behavioral:  Negative for behavioral problems and dysphoric mood. The patient is not nervous/anxious.     Past Medical History:  Diagnosis Date   Allergy    in past mild spring allergies.     Social History   Socioeconomic History   Marital status: Single    Spouse name: Not on file   Number of children: Not on file   Years of education: Not on file   Highest education level: Not on file  Occupational History   Not on file  Tobacco Use   Smoking status: Never   Smokeless tobacco: Never  Vaping Use   Vaping Use: Never used  Substance and Sexual Activity   Alcohol use: No   Drug use: No   Sexual activity: Yes    Birth control/protection: Condom  Other Topics Concern   Not on file  Social History Narrative   Not on file   Social Determinants of Health   Financial Resource Strain: Not on file  Food  Insecurity: Not on file  Transportation Needs: Not on file  Physical Activity: Not on file  Stress: Not on file  Social Connections: Not on file  Intimate Partner Violence: Not on file    No past surgical history on file.  No family history on file.  No Known Allergies  Current Outpatient Medications on File Prior to Visit  Medication Sig Dispense Refill   Norethindrone Acetate-Ethinyl Estrad-FE (LOESTRIN 24 FE) 1-20 MG-MCG(24) tablet Take 1 tablet by mouth daily. 28 tablet 11   No current facility-administered medications on file prior to visit.    BP 107/60   Pulse 62   Temp 98.1 F (36.7 C)   Resp 18   Ht 5\' 2"  (1.575 m)   Wt 107 lb 6.4 oz (48.7 kg)   SpO2 100%   BMI 19.64 kg/m        Objective:   Physical Exam  General Mental Status- Alert. General Appearance- Not in acute distress.   Skin General: Color- Normal Color. Moisture- Normal Moisture.  Neck Carotid Arteries- Normal color. Moisture- Normal Moisture. No carotid bruits. No JVD.  Chest and Lung Exam Auscultation: Breath Sounds:-Normal.  Cardiovascular Auscultation:Rythm- Regular. Murmurs & Other Heart Sounds:Auscultation of the heart reveals- No Murmurs.  Abdomen Inspection:-Inspeection Normal. Palpation/Percussion:Note:No mass. Palpation and Percussion of the abdomen reveal- Non Tender, Non Distended + BS, no rebound or guarding.   Neurologic  Cranial Nerve exam:- CN III-XII intact(No nystagmus), symmetric smile. Strength:- 5/5 equal and symmetric strength both upper and lower extremities.       Assessment & Plan:   Patient Instructions  For you wellness exam today I have ordered cbc, cmp and lipid panel.  Vaccine declined today  Recommend exercise and healthy diet.  We will let you know lab results as they come in.  Follow up date appointment will be determined after lab review.     Esperanza Richters, PA-C

## 2022-01-16 ENCOUNTER — Ambulatory Visit: Payer: BC Managed Care – PPO | Admitting: Medical

## 2022-01-16 VITALS — BP 122/69 | HR 69 | Resp 18 | Ht 62.0 in | Wt 110.8 lb

## 2022-01-16 DIAGNOSIS — R300 Dysuria: Secondary | ICD-10-CM

## 2022-01-16 LAB — POCT URINALYSIS DIPSTICK
Bilirubin, UA: NEGATIVE
Blood, UA: NEGATIVE
Glucose, UA: NEGATIVE
Ketones, UA: NEGATIVE
Leukocytes, UA: NEGATIVE
Nitrite, UA: POSITIVE
Protein, UA: NEGATIVE
Spec Grav, UA: 1.02 (ref 1.010–1.025)
Urobilinogen, UA: 0.2 E.U./dL
pH, UA: 6 (ref 5.0–8.0)

## 2022-01-16 MED ORDER — PHENAZOPYRIDINE HCL 200 MG PO TABS: 200.0000 mg | ORAL_TABLET | Freq: Three times a day (TID) | ORAL | 0 refills | Status: DC | PRN

## 2022-01-16 MED ORDER — NITROFURANTOIN MONOHYD MACRO 100 MG PO CAPS
100.0000 mg | ORAL_CAPSULE | Freq: Two times a day (BID) | ORAL | 0 refills | Status: DC
Start: 2022-01-16 — End: 2022-04-26

## 2022-01-16 NOTE — Patient Instructions (Addendum)
You appear to have  urinary tract infection. I am prescribing macrobid antibiotic and recommend hydrating  well. I am sending out a urine culture. During the interim if your signs and symptoms worsen rather than improving please notify us. We will notify your when the culture results are back.  Making pyridium available if needed for pain.  Follow up in 7 days or as needed.

## 2022-01-16 NOTE — Progress Notes (Signed)
   Subjective:    Patient ID: Stacey Mayer, female    DOB: [birth year], 21 y.o.   MRN: [id number]  HPI  Pt in today reporting urinary symptoms since Sunday.  Dysuria- yes Frequent urination-no. Hesitancy-no Suprapubic pressure-no Fever-no chills-no Nausea-no Vomiting-no CVA pain-no History of UTI- last uti was 5 years Gross hematuria- no  Lmp- this Monday.   Review of Systems  Constitutional:  Negative for chills, fatigue and fever.  Respiratory:  Negative for cough, chest tightness, shortness of breath and wheezing.   Cardiovascular:  Negative for chest pain and palpitations.  Gastrointestinal:  Negative for abdominal pain.  Genitourinary:  Positive for dysuria. Negative for difficulty urinating, flank pain, frequency, menstrual problem and urgency.  Neurological:  Negative for dizziness, syncope, numbness and headaches.  Hematological:  Negative for adenopathy.  Psychiatric/Behavioral:  Negative for behavioral problems and confusion.     Past Medical History:  Diagnosis Date   Allergy    in past mild spring allergies.     Social History   Socioeconomic History   Marital status: Single    Spouse name: Not on file   Number of children: Not on file   Years of education: Not on file   Highest education level: Not on file  Occupational History   Not on file  Tobacco Use   Smoking status: Never   Smokeless tobacco: Never  Vaping Use   Vaping Use: Never used  Substance and Sexual Activity   Alcohol use: No   Drug use: No   Sexual activity: Yes    Birth control/protection: Condom  Other Topics Concern   Not on file  Social History Narrative   Not on file   Social Determinants of Health   Financial Resource Strain: Not on file  Food Insecurity: Not on file  Transportation Needs: Not on file  Physical Activity: Not on file  Stress: Not on file  Social Connections: Not on file  Intimate Partner Violence: Not on file    No past surgical history on  file.  No family history on file.  No Known Allergies  Current Outpatient Medications on File Prior to Visit  Medication Sig Dispense Refill   Norethindrone Acetate-Ethinyl Estrad-FE (LOESTRIN 24 FE) 1-20 MG-MCG(24) tablet Take 1 tablet by mouth daily. 28 tablet 11   No current facility-administered medications on file prior to visit.    BP 122/69   Pulse 69   Resp 18   Ht 5\' 2"  (1.575 m)   Wt 110 lb 12.8 oz (50.3 kg)   LMP 01/07/2022   SpO2 100%   BMI 20.27 kg/m        Objective:   Physical Exam  General- No acute distress. Pleasant patient. Neck- Full range of motion, no jvd Lungs- Clear, even and unlabored. Heart- regular rate and rhythm. Neurologic- CNII- XII grossly intact.  Abdomen-soft, nt, nd, +bs, no rebound or guarding. No organomegally. Back- no cva tenderness.      Assessment & Plan:   Patient Instructions  Your appear probable urinary tract infection. I am prescribing macrobid antibiotic for the probable infection. Hydrate well. I am sending out a urine culture. During the interim if your signs and symptoms worsen rather than improving please notify 01/09/2022. We will notify your when the culture results are back.  Making pyridium available if needed.  Follow up in 7 days or as needed.     [place], PA-C

## 2022-01-17 ENCOUNTER — Encounter: Payer: Self-pay | Admitting: General Practice

## 2022-02-26 ENCOUNTER — Encounter: Payer: Self-pay | Admitting: Family Medicine

## 2022-04-25 ENCOUNTER — Ambulatory Visit: Payer: BC Managed Care – PPO | Admitting: Family Medicine

## 2022-04-26 ENCOUNTER — Other Ambulatory Visit (HOSPITAL_COMMUNITY)
Admission: RE | Admit: 2022-04-26 | Discharge: 2022-04-26 | Disposition: A | Discharge status: Home / Self Care | Payer: BC Managed Care – PPO | Source: Ambulatory Visit | Attending: Family Medicine | Admitting: Family Medicine

## 2022-04-26 ENCOUNTER — Encounter: Payer: Self-pay | Admitting: Obstetrics and Gynecology

## 2022-04-26 ENCOUNTER — Ambulatory Visit (INDEPENDENT_AMBULATORY_CARE_PROVIDER_SITE_OTHER): Payer: BC Managed Care – PPO | Admitting: Obstetrics and Gynecology

## 2022-04-26 VITALS — BP 113/65 | HR 76 | Ht 62.0 in | Wt 113.0 lb

## 2022-04-26 DIAGNOSIS — Z01419 Encounter for gynecological examination (general) (routine) without abnormal findings: Secondary | ICD-10-CM

## 2022-04-26 DIAGNOSIS — N898 Other specified noninflammatory disorders of vagina: Secondary | ICD-10-CM

## 2022-04-26 NOTE — Progress Notes (Signed)
   ANNUAL EXAM Patient name: Stacey Mayer MRN [id number]  Date of birth: Apr 29, 2001 Chief Complaint:   Gynecologic Exam  History of Present Illness:   Stacey Mayer is a 21 y.o. G0P0000 female being seen today for a routine annual exam.  Current complaints: none Currently on OCPs without any issues. Interested in vaginal swab testing for BV/YEAST/GC/CT though denies discharge. Declines serum STI testing. No breast complaints. No significant changes in medical or surgical history.   Patient's last menstrual period was 04/01/2022 (exact date).  Last pap 11/2019. Results were: NILM Last mammogram: n/a Last colonoscopy: n/a     12/14/2021    9:12 AM  Depression screen PHQ 2/9  Decreased Interest 1  Down, Depressed, Hopeless 1  PHQ - 2 Score 2  Altered sleeping 1  Tired, decreased energy 1  Change in appetite 1  Feeling bad or failure about yourself  1  Trouble concentrating 0  Moving slowly or fidgety/restless 0  Suicidal thoughts 0  PHQ-9 Score 6  Difficult doing work/chores Not difficult at all         No data to display           Review of Systems:   Pertinent items are noted in HPI Denies any headaches, blurred vision, fatigue, shortness of breath, chest pain, abdominal pain, abnormal vaginal discharge/itching/odor/irritation, problems with periods, bowel movements, urination, or intercourse unless otherwise stated above. Pertinent History Reviewed:  Reviewed past medical,surgical, social and family history.  Reviewed problem list, medications and allergies. Physical Assessment:   Vitals:   04/26/22 1046  BP: 113/65  Pulse: 76  Weight: 113 lb (51.3 kg)  Height: 5\' 2"  (1.575 m)  Body mass index is 20.67 kg/m.        Physical Examination:   General appearance - well appearing, and in no distress  Mental status - alert, oriented to person, place, and time  Psych:  She has a normal mood and affect  Skin - warm and dry, normal color, no suspicious lesions  noted  Chest - effort normal, all lung fields clear to auscultation bilaterally  Heart - normal rate and regular rhythm  Breasts - declined  Abdomen - soft, nontender, nondistended, no masses or organomegaly  Pelvic - declined  Extremities:  No swelling or varicosities noted  Chaperone present for exam  No results found for this or any previous visit (from the past 24 hour(s)).   Assessment & Plan:  1. Well woman exam with routine gynecological exam Routine exam, pap due 2024  2. Vaginal discharge Will follow up result - Cervicovaginal ancillary only( [place])   No orders of the defined types were placed in this encounter.   Meds: No orders of the defined types were placed in this encounter.   Follow-up: Return for Annual GYN.  2025, MD 04/26/2022 12:11 PM

## 2022-04-29 LAB — CERVICOVAGINAL ANCILLARY ONLY
Bacterial Vaginitis (gardnerella): NEGATIVE
Candida Glabrata: NEGATIVE
Candida Vaginitis: NEGATIVE
Chlamydia: NEGATIVE
Comment: NEGATIVE
Comment: NEGATIVE
Comment: NEGATIVE
Comment: NEGATIVE
Comment: NEGATIVE
Comment: NORMAL
Neisseria Gonorrhea: NEGATIVE
Trichomonas: NEGATIVE

## 2022-07-09 ENCOUNTER — Ambulatory Visit
Admission: EM | Admit: 2022-07-09 | Discharge: 2022-07-09 | Disposition: A | Discharge status: Home / Self Care | Payer: BC Managed Care – PPO | Attending: Nurse Practitioner | Admitting: Nurse Practitioner

## 2022-07-09 ENCOUNTER — Ambulatory Visit: Admit: 2022-07-09 | Discharge status: Against Medical Advice | Payer: BC Managed Care – PPO

## 2022-07-09 DIAGNOSIS — N760 Acute vaginitis: Secondary | ICD-10-CM | POA: Insufficient documentation

## 2022-07-09 DIAGNOSIS — L292 Pruritus vulvae: Secondary | ICD-10-CM | POA: Diagnosis not present

## 2022-07-09 DIAGNOSIS — Z113 Encounter for screening for infections with a predominantly sexual mode of transmission: Secondary | ICD-10-CM | POA: Insufficient documentation

## 2022-07-09 NOTE — ED Triage Notes (Signed)
Pt co vaginal discharge and itching x 1 week. The pt denies other symptoms.   Home interventions: none

## 2022-07-09 NOTE — Discharge Instructions (Signed)
The clinical contact you with results of the testing done today if positive Follow-up with your gynecologist as needed

## 2022-07-09 NOTE — ED Provider Notes (Signed)
[facility]    CSN: [id number] Arrival date & time: 07/09/22  1213      History   Chief Complaint Chief Complaint  Patient presents with   Vaginal Discharge   Vaginal Itching    HPI Stacey Mayer is a 22 y.o. female  who presents for evaluation of vaginal discharge. Pt reports symptoms began 1 week ago.  Patient reports a malodorous intermittently pruritic discharge. denies dysuria. No STD concern or exposure but she would like screening.  Patient states she has not recently been treated with antibiotics but chart review shows on 07/03/2022 she was seen at [facility] and treated for BV with Flagyl.  Patient has a history of vaginal infections such as yeast infection or BV in the past. Patient has used nothing OTC medications for symptoms. Pt has no other concerns at this time.    Vaginal Discharge Associated symptoms: vaginal itching   Vaginal Itching    Past Medical History:  Diagnosis Date   Allergy    in past mild spring allergies.    Patient Active Problem List   Diagnosis Date Noted   Urticaria 04/01/2019   Other allergic rhinitis 04/01/2019    History reviewed. No pertinent surgical history.  OB History     Gravida  0   Para  0   Term  0   Preterm  0   AB  0   Living  0      SAB  0   IAB  0   Ectopic  0   Multiple  0   Live Births  0            Home Medications    Prior to Admission medications   Medication Sig Start Date End Date Taking? Authorizing Provider  Norethindrone Acetate-Ethinyl Estrad-FE (LOESTRIN 24 FE) 1-20 MG-MCG(24) tablet Take 1 tablet by mouth daily. 11/13/21   Seabron Spates, CNM  phenazopyridine (PYRIDIUM) 200 MG tablet Take 1 tablet (200 mg total) by mouth 3 (three) times daily as needed for pain. Patient not taking: Reported on 04/26/2022 01/16/22   Saguier, Iris Pert    Family History History reviewed. No pertinent family history.  Social History Social History   Tobacco Use    Smoking status: Never   Smokeless tobacco: Never  Vaping Use   Vaping Use: Never used  Substance Use Topics   Alcohol use: No   Drug use: No     Allergies   Patient has no known allergies.   Review of Systems Review of Systems   Physical Exam Triage Vital Signs ED Triage Vitals  Enc Vitals Group     BP 07/09/22 1225 112/64     Pulse Rate 07/09/22 1225 65     Resp 07/09/22 1225 16     Temp 07/09/22 1225 97.7 F (36.5 C)     Temp Source 07/09/22 1225 Other     SpO2 07/09/22 1225 100 %     Weight --      Height --      Head Circumference --      Peak Flow --      Pain Score 07/09/22 1224 0     Pain Loc --      Pain Edu? --      Excl. in [place]? --    No data found.  Updated Vital Signs BP 112/64 (BP Location: Left Arm)   Pulse 65   Temp 97.7 F (36.5 C) (Other (Comment))  Resp 16   LMP 06/17/2022   SpO2 100%   Visual Acuity Right Eye Distance:   Left Eye Distance:   Bilateral Distance:    Right Eye Near:   Left Eye Near:    Bilateral Near:     Physical Exam Vitals and nursing note reviewed.  Constitutional:      Appearance: Normal appearance.  HENT:     Head: Normocephalic and atraumatic.  Eyes:     Pupils: Pupils are equal, round, and reactive to light.  Cardiovascular:     Rate and Rhythm: Normal rate.  Pulmonary:     Effort: Pulmonary effort is normal.  Abdominal:     Tenderness: There is no right CVA tenderness or left CVA tenderness.  Skin:    General: Skin is warm and dry.  Neurological:     General: No focal deficit present.     Mental Status: She is alert and oriented to person, place, and time.  Psychiatric:        Mood and Affect: Mood normal.        Behavior: Behavior normal.      UC Treatments / Results  Labs (all labs ordered are listed, but only abnormal results are displayed) Labs Reviewed  CERVICOVAGINAL ANCILLARY ONLY    EKG   Radiology No results found.  Procedures Procedures (including critical care  time)  Medications Ordered in UC Medications - No data to display  Initial Impression / Assessment and Plan / UC Course  I have reviewed the triage vital signs and the nursing notes.  Pertinent labs & imaging results that were available during my care of the patient were reviewed by me and considered in my medical decision making (see chart for details).     Patient self swabbed for vaginal testing done today.  Will contact for any positive results Denies dysuria Follow-up with PCP as needed ER precautions reviewed and patient verbalized understanding Final Clinical Impressions(s) / UC Diagnoses   Final diagnoses:  Acute vaginitis     Discharge Instructions      The clinical contact you with results of the testing done today if positive Follow-up with your gynecologist as needed   ED Prescriptions   None    PDMP not reviewed this encounter.   Melynda Ripple, NP 07/09/22 1252

## 2022-07-10 LAB — CERVICOVAGINAL ANCILLARY ONLY
Bacterial Vaginitis (gardnerella): NEGATIVE
Candida Glabrata: NEGATIVE
Candida Vaginitis: NEGATIVE
Chlamydia: NEGATIVE
Comment: NEGATIVE
Comment: NEGATIVE
Comment: NEGATIVE
Comment: NEGATIVE
Comment: NEGATIVE
Comment: NORMAL
Neisseria Gonorrhea: NEGATIVE
Trichomonas: NEGATIVE

## 2022-07-11 ENCOUNTER — Other Ambulatory Visit: Payer: Self-pay | Admitting: Family Medicine

## 2022-07-11 DIAGNOSIS — Z3041 Encounter for surveillance of contraceptive pills: Secondary | ICD-10-CM

## 2022-09-19 ENCOUNTER — Telehealth: Payer: Self-pay | Admitting: *Deleted

## 2022-09-19 NOTE — Telephone Encounter (Signed)
TC from pt reporting spotting on last 4 days of Lo Loestrin pack, then "normal" period after that.  Advised breakthrough bleeding is common. Advised to continue taking pills as prescribed. Offered virtual appt to discuss further with provider. Pt would like to know if she can try skipping "the sugar pills" and starting a new pack. Advised that is a safe option. Advised to seek care for heavy bleeding, soaking a pad or more per hour. Call transferred to schedulers.

## 2022-09-30 ENCOUNTER — Telehealth: Payer: BC Managed Care – PPO | Admitting: Obstetrics and Gynecology

## 2022-10-01 ENCOUNTER — Ambulatory Visit
Admission: RE | Admit: 2022-10-01 | Discharge: 2022-10-01 | Disposition: A | Discharge status: Home / Self Care | Payer: BC Managed Care – PPO | Source: Ambulatory Visit | Attending: Internal Medicine | Admitting: Internal Medicine

## 2022-10-01 ENCOUNTER — Telehealth: Payer: Self-pay

## 2022-10-01 VITALS — BP 124/78 | HR 60 | Temp 98.6°F | Resp 16

## 2022-10-01 DIAGNOSIS — N3001 Acute cystitis with hematuria: Secondary | ICD-10-CM | POA: Diagnosis present

## 2022-10-01 LAB — POCT URINALYSIS DIP (MANUAL ENTRY)
Bilirubin, UA: NEGATIVE
Glucose, UA: NEGATIVE mg/dL
Ketones, POC UA: NEGATIVE mg/dL
Nitrite, UA: POSITIVE — AB
Protein Ur, POC: NEGATIVE mg/dL
Spec Grav, UA: 1.025 (ref 1.010–1.025)
Urobilinogen, UA: 0.2 E.U./dL
pH, UA: 5.5 (ref 5.0–8.0)

## 2022-10-01 LAB — POCT URINE PREGNANCY: Preg Test, Ur: NEGATIVE

## 2022-10-01 MED ORDER — NITROFURANTOIN MONOHYD MACRO 100 MG PO CAPS
100.0000 mg | ORAL_CAPSULE | Freq: Two times a day (BID) | ORAL | 0 refills | Status: AC
Start: 2022-10-01 — End: 2022-10-08

## 2022-10-01 NOTE — Telephone Encounter (Deleted)
Spoke with pt's mother regarding pt's allergic reaction symptoms. Mother confirms that swelling is localized to pt's upper eyelids with no tongue or lip involvement. Mother reports pt has been feeding normally, and not been in any respiratory distress. Advised mother to take pt to the Emergency Department if he began to have any difficulty breathing or oral swelling. Mother verbalized understanding. Plan to keep UC appt for the time being.

## 2022-10-01 NOTE — Discharge Instructions (Signed)
The clinic will contact you with results of the urine culture done today if positive Start Macrobid twice daily for 7 days Rest and fluids Follow-up with your PCP if your symptoms do not improve Please go to the ER for any worsening symptoms Hope you feel better soon!

## 2022-10-01 NOTE — Telephone Encounter (Deleted)
Previous note was entered on the incorrect patient.

## 2022-10-01 NOTE — ED Triage Notes (Signed)
Pt c/o dysuria that began last night. Denies further symptoms. Has not taken any meds.

## 2022-10-01 NOTE — ED Provider Notes (Signed)
[facility]    CSN: [id number] Arrival date & time: 10/01/22  1341      History   Chief Complaint Chief Complaint  Patient presents with   Dysuria    HPI Stacey Mayer is a 22 y.o. female presents for evaluation of dysuria. Pt reports 1 days of urinary burning.  Denies any urinary frequency, urgency, hematuria, fevers, nausea/vomiting, flank pain.  No vaginal discharge or STD concern.  Does have history of UTIs.  Has not taken any OTC medications for symptoms.  No other concerns at this time.   Dysuria   Past Medical History:  Diagnosis Date   Allergy    in past mild spring allergies.    Patient Active Problem List   Diagnosis Date Noted   Urticaria 04/01/2019   Other allergic rhinitis 04/01/2019    History reviewed. No pertinent surgical history.  OB History     Gravida  0   Para  0   Term  0   Preterm  0   AB  0   Living  0      SAB  0   IAB  0   Ectopic  0   Multiple  0   Live Births  0            Home Medications    Prior to Admission medications   Medication Sig Start Date End Date Taking? Authorizing Provider  nitrofurantoin, macrocrystal-monohydrate, (MACROBID) 100 MG capsule Take 1 capsule (100 mg total) by mouth 2 (two) times daily for 7 days. 10/01/22 10/08/22 Yes Radford Pax, NP  LO LOESTRIN FE 1 MG-10 MCG / 10 MCG tablet TAKE 1 TABLET BY MOUTH EVERY DAY 07/12/22   Levie Heritage, DO  Norethindrone Acetate-Ethinyl Estrad-FE (LOESTRIN 24 FE) 1-20 MG-MCG(24) tablet Take 1 tablet by mouth daily. 11/13/21   Aviva Signs, CNM  phenazopyridine (PYRIDIUM) 200 MG tablet Take 1 tablet (200 mg total) by mouth 3 (three) times daily as needed for pain. Patient not taking: Reported on 04/26/2022 01/16/22   Saguier, Kateri Mc    Family History History reviewed. No pertinent family history.  Social History Social History   Tobacco Use   Smoking status: Never   Smokeless tobacco: Never  Vaping Use   Vaping Use: Never  used  Substance Use Topics   Alcohol use: No   Drug use: No     Allergies   Patient has no known allergies.   Review of Systems Review of Systems  Genitourinary:  Positive for dysuria.     Physical Exam Triage Vital Signs ED Triage Vitals  Enc Vitals Group     BP 10/01/22 1417 124/78     Pulse Rate 10/01/22 1417 60     Resp 10/01/22 1417 16     Temp 10/01/22 1417 98.6 F (37 C)     Temp Source 10/01/22 1417 Oral     SpO2 10/01/22 1417 98 %     Weight --      Height --      Head Circumference --      Peak Flow --      Pain Score 10/01/22 1414 0     Pain Loc --      Pain Edu? --      Excl. in [place]? --    No data found.  Updated Vital Signs BP 124/78   Pulse 60   Temp 98.6 F (37 C) (Oral)   Resp 16   LMP  09/05/2022 (Approximate)   SpO2 98%   Visual Acuity Right Eye Distance:   Left Eye Distance:   Bilateral Distance:    Right Eye Near:   Left Eye Near:    Bilateral Near:     Physical Exam Vitals and nursing note reviewed.  Constitutional:      Appearance: Normal appearance.  HENT:     Head: Normocephalic and atraumatic.  Eyes:     Pupils: Pupils are equal, round, and reactive to light.  Cardiovascular:     Rate and Rhythm: Normal rate.  Pulmonary:     Effort: Pulmonary effort is normal.  Abdominal:     Tenderness: There is no right CVA tenderness or left CVA tenderness.  Skin:    General: Skin is warm and dry.  Neurological:     General: No focal deficit present.     Mental Status: She is alert and oriented to person, place, and time.  Psychiatric:        Mood and Affect: Mood normal.        Behavior: Behavior normal.      UC Treatments / Results  Labs (all labs ordered are listed, but only abnormal results are displayed) Labs Reviewed  POCT URINALYSIS DIP (MANUAL ENTRY) - Abnormal; Notable for the following components:      Result Value   Clarity, UA cloudy (*)    Blood, UA trace-intact (*)    Nitrite, UA Positive (*)     Leukocytes, UA Small (1+) (*)    All other components within normal limits  URINE CULTURE  POCT URINE PREGNANCY    EKG   Radiology No results found.  Procedures Procedures (including critical care time)  Medications Ordered in UC Medications - No data to display  Initial Impression / Assessment and Plan / UC Course  I have reviewed the triage vital signs and the nursing notes.  Pertinent labs & imaging results that were available during my care of the patient were reviewed by me and considered in my medical decision making (see chart for details).     UA positive for UTI, will culture.  Start Macrobid Increase fluids PCP follow-up if symptoms do not improve ER precautions reviewed and patient verbalized understanding Final Clinical Impressions(s) / UC Diagnoses   Final diagnoses:  Acute cystitis with hematuria     Discharge Instructions      The clinic will contact you with results of the urine culture done today if positive Start Macrobid twice daily for 7 days Rest and fluids Follow-up with your PCP if your symptoms do not improve Please go to the ER for any worsening symptoms Hope you feel better soon!    ED Prescriptions     Medication Sig Dispense Auth. Provider   nitrofurantoin, macrocrystal-monohydrate, (MACROBID) 100 MG capsule Take 1 capsule (100 mg total) by mouth 2 (two) times daily for 7 days. 14 capsule Radford Pax, NP      PDMP not reviewed this encounter.   Radford Pax, NP 10/01/22 1435

## 2022-10-02 LAB — URINE CULTURE: Culture: >=100000 — AB

## 2022-10-03 ENCOUNTER — Telehealth: Payer: Self-pay

## 2022-10-03 LAB — URINE CULTURE

## 2022-10-03 NOTE — Telephone Encounter (Signed)
Received call from pt with questions regarding urine culture results. This Clinical research associate reached out to K. Pelkey, RN, who confirmed pt was on proper antibiotic. This RN called pt back to inform her of same. Questions asked and answered. Education on UTI prevention and abx treatment given. Pt verbalized understanding.

## 2022-10-23 ENCOUNTER — Other Ambulatory Visit: Payer: Self-pay

## 2022-10-23 MED ORDER — FLUCONAZOLE 150 MG PO TABS: 150.0000 mg | ORAL_TABLET | Freq: Once | ORAL | 1 refills | Status: AC

## 2022-10-23 NOTE — Progress Notes (Signed)
Patient called stating that she is having vaginal discharge that is thicker than normal patient is also is having vaginal itching.  Patient given diflucan per protocol. Patient made aware to call back if symptoms persist. Armandina Stammer RN

## 2022-11-13 ENCOUNTER — Encounter: Payer: Self-pay | Admitting: Obstetrics and Gynecology

## 2022-11-13 ENCOUNTER — Ambulatory Visit (INDEPENDENT_AMBULATORY_CARE_PROVIDER_SITE_OTHER): Payer: BC Managed Care – PPO

## 2022-11-13 VITALS — BP 117/62 | HR 78

## 2022-11-13 DIAGNOSIS — R8290 Unspecified abnormal findings in urine: Secondary | ICD-10-CM | POA: Diagnosis not present

## 2022-11-13 LAB — POCT URINALYSIS DIPSTICK
Glucose, UA: NEGATIVE
Ketones, UA: POSITIVE
Nitrite, UA: POSITIVE
Protein, UA: NEGATIVE
Spec Grav, UA: 1.015 (ref 1.010–1.025)
Urobilinogen, UA: 0.2 E.U./dL
pH, UA: 5.5 (ref 5.0–8.0)

## 2022-11-13 MED ORDER — NITROFURANTOIN MONOHYD MACRO 100 MG PO CAPS: 100.0000 mg | ORAL_CAPSULE | Freq: Two times a day (BID) | ORAL | 0 refills | Status: DC

## 2022-11-13 NOTE — Progress Notes (Signed)
Patient complaining of urine odor and urinary frequency. Patient had UTI in April that was treated by ER. Armandina Stammer RN

## 2022-11-16 LAB — URINE CULTURE

## 2022-11-19 MED ORDER — SULFAMETHOXAZOLE-TRIMETHOPRIM 800-160 MG PO TABS: 1.0000 | ORAL_TABLET | Freq: Two times a day (BID) | ORAL | 1 refills | Status: DC

## 2022-11-19 MED ORDER — FLUCONAZOLE 150 MG PO TABS: 150.0000 mg | ORAL_TABLET | Freq: Once | ORAL | 0 refills | Status: AC

## 2022-11-19 NOTE — Addendum Note (Signed)
Addended by: Levie Heritage on: 11/19/2022 11:35 AM   Modules accepted: Orders

## 2022-12-30 ENCOUNTER — Encounter: Payer: Self-pay | Admitting: Family Medicine

## 2022-12-31 ENCOUNTER — Other Ambulatory Visit (HOSPITAL_COMMUNITY)
Admission: RE | Admit: 2022-12-31 | Discharge: 2022-12-31 | Disposition: A | Discharge status: Home / Self Care | Payer: BC Managed Care – PPO | Source: Ambulatory Visit | Attending: Family Medicine | Admitting: Family Medicine

## 2022-12-31 ENCOUNTER — Ambulatory Visit: Payer: BC Managed Care – PPO

## 2022-12-31 VITALS — BP 121/74 | HR 74 | Wt 113.0 lb

## 2022-12-31 DIAGNOSIS — N898 Other specified noninflammatory disorders of vagina: Secondary | ICD-10-CM | POA: Diagnosis present

## 2022-12-31 DIAGNOSIS — R300 Dysuria: Secondary | ICD-10-CM

## 2022-12-31 NOTE — Progress Notes (Signed)
Patient states that she is discharge for two days. Patients denies any itching or burning. Patient said she plays soccer and noticed the discharge after playing soccer outside in the heat.  Patient also states that she is having discomfort when urinating. Patient states she feels likes she can't empty all the way but has visit tomorrow with her PCP because she feels she needs a referral to urology. Patient request we send urine  culture today. Armandina Stammer RN

## 2023-01-01 ENCOUNTER — Ambulatory Visit: Payer: BC Managed Care – PPO | Admitting: Medical

## 2023-01-01 LAB — CERVICOVAGINAL ANCILLARY ONLY
Bacterial Vaginitis (gardnerella): NEGATIVE
Candida Glabrata: NEGATIVE
Candida Vaginitis: NEGATIVE
Comment: NEGATIVE
Comment: NEGATIVE
Comment: NEGATIVE

## 2023-01-02 LAB — URINE CULTURE

## 2023-03-19 ENCOUNTER — Other Ambulatory Visit (HOSPITAL_COMMUNITY)
Admission: RE | Admit: 2023-03-19 | Discharge: 2023-03-19 | Disposition: A | Discharge status: Home / Self Care | Payer: BC Managed Care – PPO | Source: Ambulatory Visit | Attending: Medical | Admitting: Medical

## 2023-03-19 ENCOUNTER — Ambulatory Visit: Payer: BC Managed Care – PPO | Admitting: Medical

## 2023-03-19 VITALS — BP 96/56 | HR 76 | Temp 98.0°F | Resp 16 | Ht 62.0 in | Wt 116.4 lb

## 2023-03-19 DIAGNOSIS — Z113 Encounter for screening for infections with a predominantly sexual mode of transmission: Secondary | ICD-10-CM | POA: Insufficient documentation

## 2023-03-19 DIAGNOSIS — R300 Dysuria: Secondary | ICD-10-CM | POA: Insufficient documentation

## 2023-03-19 DIAGNOSIS — R8290 Unspecified abnormal findings in urine: Secondary | ICD-10-CM

## 2023-03-19 LAB — POCT URINALYSIS DIPSTICK
Blood, UA: NEGATIVE
Glucose, UA: NEGATIVE
Leukocytes, UA: NEGATIVE
Nitrite, UA: NEGATIVE
Protein, UA: NEGATIVE
Spec Grav, UA: 1.025 (ref 1.010–1.025)
Urobilinogen, UA: 0.2 U/dL — AB
pH, UA: 6 (ref 5.0–8.0)

## 2023-03-19 LAB — POC URINALSYSI DIPSTICK (AUTOMATED)
Blood, UA: NEGATIVE
Glucose, UA: NEGATIVE
Leukocytes, UA: NEGATIVE
Nitrite, UA: NEGATIVE
Protein, UA: NEGATIVE
Spec Grav, UA: 1.02 (ref 1.010–1.025)
Urobilinogen, UA: 0.2 U/dL
pH, UA: 5 (ref 5.0–8.0)

## 2023-03-19 NOTE — Patient Instructions (Addendum)
Bad odor of urine - POCT Urinalysis Dipstick -Urine culture  Screen for STD (sexually transmitted disease) -Will also he had cervicovaginal swab testing including gonorrhea, chlamydia, trichomonas, BV and Candida.  Will follow your testing results and treat accordingly.  If studies negative then most likely refer to urologist for guidance of urology for evaluation as this was recommended by a gynecologist on her last visit.  Do recommend that you stay well-hydrated as I do think concentrated urine or more likely to have odor.  Also want you to go ahead and schedule a wellness exam in 1 to 2 months.  Schedule early morning and will do fasting labs on that date.  Will update you on the study results as they come in.  You can update [place] if changing or worsening signs/symptoms.

## 2023-03-19 NOTE — Progress Notes (Unsigned)
   Subjective:    Patient ID: Stacey Mayer, female    DOB: Nov 12, 2000, 22 y.o.   MRN: [id number]  HPI  Pt in for recent odor to pt urine.  Pt states when drinks a lot of water the odor is not present. No burning or frequent urination. No fever, no chills, no sweats or body aches.   Pt last menses this week.  Early in summer pt had work up and her cervicovaginal ancillary swab by gyn was negative. These studies were done for same complaint.   At that time in July the gyn suggested pt may need to see the urologist.     Review of Systems See hpi.    Objective:   Physical Exam  General- No acute distress. Pleasant patient. Neck- Full range of motion, no jvd Lungs- Clear, even and unlabored. Heart- regular rate and rhythm. Neurologic- CNII- XII grossly intact.  Abdomen-soft, nontender, nondistended positive bowel sounds. Back-no CVA tenderness.     Assessment & Plan:   Patient Instructions  Bad odor of urine - POCT Urinalysis Dipstick -Urine culture  Screen for STD (sexually transmitted disease) -Will also he had cervicovaginal swab testing including gonorrhea, chlamydia, trichomonas, BV and Candida.  Will follow your testing results and treat accordingly.  If studies negative then most likely refer to urologist for guidance of urology for evaluation as this was recommended by a gynecologist on her last visit.  Do recommend that you stay well-hydrated as I do think concentrated urine or more likely to have odor.  Also want you to go ahead and schedule a wellness exam in 1 to 2 months.  Schedule early morning and will do fasting labs on that date.  Will update you on the study results as they come in.  You can update [place] if changing or worsening signs/symptoms.      Esperanza Richters, PA-C

## 2023-03-24 LAB — CERVICOVAGINAL ANCILLARY ONLY
Bacterial Vaginitis (gardnerella): NEGATIVE
Candida Glabrata: NEGATIVE
Candida Vaginitis: NEGATIVE
Chlamydia: NEGATIVE
Comment: NEGATIVE
Comment: NEGATIVE
Comment: NEGATIVE
Comment: NEGATIVE
Comment: NEGATIVE
Comment: NORMAL
Neisseria Gonorrhea: NEGATIVE
Trichomonas: NEGATIVE

## 2023-04-25 ENCOUNTER — Ambulatory Visit (INDEPENDENT_AMBULATORY_CARE_PROVIDER_SITE_OTHER): Payer: BC Managed Care – PPO | Admitting: Medical

## 2023-04-25 VITALS — BP 116/76 | HR 66 | Temp 98.0°F | Resp 18 | Ht 62.0 in | Wt 116.0 lb

## 2023-04-25 DIAGNOSIS — Z0000 Encounter for general adult medical examination without abnormal findings: Secondary | ICD-10-CM

## 2023-04-25 LAB — COMPREHENSIVE METABOLIC PANEL
ALT: 9 U/L (ref 0–35)
AST: 14 U/L (ref 0–37)
Albumin: 4.7 g/dL (ref 3.5–5.2)
Alkaline Phosphatase: 40 U/L (ref 39–117)
BUN: 15 mg/dL (ref 6–23)
CO2: 28 meq/L (ref 19–32)
Calcium: 9.4 mg/dL (ref 8.4–10.5)
Chloride: 104 meq/L (ref 96–112)
Creatinine, Ser: 0.73 mg/dL (ref 0.40–1.20)
GFR: 115.05 mL/min (ref >60.00)
Glucose, Bld: 61 mg/dL — ABNORMAL LOW (ref 70–99)
Potassium: 4.3 meq/L (ref 3.5–5.1)
Sodium: 138 meq/L (ref 135–145)
Total Bilirubin: 0.5 mg/dL (ref 0.2–1.2)
Total Protein: 7.2 g/dL (ref 6.0–8.3)

## 2023-04-25 LAB — LIPID PANEL
Cholesterol: 172 mg/dL (ref 0–200)
HDL: 52.9 mg/dL (ref >39.00)
LDL Cholesterol: 97 mg/dL (ref 0–99)
NonHDL: 118.92
Total CHOL/HDL Ratio: 3
Triglycerides: 112 mg/dL (ref 0.0–149.0)
VLDL: 22.4 mg/dL (ref 0.0–40.0)

## 2023-04-25 LAB — CBC WITH DIFFERENTIAL/PLATELET
Basophils Absolute: 0 10*3/uL (ref 0.0–0.1)
Basophils Relative: 0.8 % (ref 0.0–3.0)
Eosinophils Absolute: 0.1 10*3/uL (ref 0.0–0.7)
Eosinophils Relative: 3.3 % (ref 0.0–5.0)
HCT: 40.5 % (ref 36.0–46.0)
Hemoglobin: 13.6 g/dL (ref 12.0–15.0)
Lymphocytes Relative: 49.4 % — ABNORMAL HIGH (ref 12.0–46.0)
Lymphs Abs: 1.9 10*3/uL (ref 0.7–4.0)
MCHC: 33.6 g/dL (ref 30.0–36.0)
MCV: 96.6 fL (ref 78.0–100.0)
Monocytes Absolute: 0.3 10*3/uL (ref 0.1–1.0)
Monocytes Relative: 7.6 % (ref 3.0–12.0)
Neutro Abs: 1.5 10*3/uL (ref 1.4–7.7)
Neutrophils Relative %: 38.9 % — ABNORMAL LOW (ref 43.0–77.0)
Platelets: 277 10*3/uL (ref 150.0–400.0)
RBC: 4.2 Mil/uL (ref 3.87–5.11)
RDW: 13.2 % (ref 11.5–15.5)
WBC: 4 10*3/uL (ref 4.0–10.5)

## 2023-04-25 NOTE — Patient Instructions (Addendum)
For you wellness exam today I have ordered cbc, cmp and lipid panel.  Tdap deferred to other day. Can schedule as nurse visit later. Flu vaccine declined. Counseled to consider and can get later as nurse visit as well.  Recommend exercise and healthy diet.  We will let you know lab results as they come in.  Follow up date appointment will be determined after lab review.     Preventive Care 18-24 Years Old, Female Preventive care refers to lifestyle choices and visits with your health care provider that can promote health and wellness. Preventive care visits are also called wellness exams. What can I expect for my preventive care visit? Counseling During your preventive care visit, your health care provider may ask about your: Medical history, including: Past medical problems. Family medical history. Pregnancy history. Current health, including: Menstrual cycle. Method of birth control. Emotional well-being. Home life and relationship well-being. Sexual activity and sexual health. Lifestyle, including: Alcohol, nicotine or tobacco, and drug use. Access to firearms. Diet, exercise, and sleep habits. Work and work Astronomer. Sunscreen use. Safety issues such as seatbelt and bike helmet use. Physical exam Your health care provider may check your: Height and weight. These may be used to calculate your BMI (body mass index). BMI is a measurement that tells if you are at a healthy weight. Waist circumference. This measures the distance around your waistline. This measurement also tells if you are at a healthy weight and may help predict your risk of certain diseases, such as type 2 diabetes and high blood pressure. Heart rate and blood pressure. Body temperature. Skin for abnormal spots. What immunizations do I need?  Vaccines are usually given at various ages, according to a schedule. Your health care provider will recommend vaccines for you based on your age, medical history,  and lifestyle or other factors, such as travel or where you work. What tests do I need? Screening Your health care provider may recommend screening tests for certain conditions. This may include: Pelvic exam and Pap test. Lipid and cholesterol levels. Diabetes screening. This is done by checking your blood sugar (glucose) after you have not eaten for a while (fasting). Hepatitis B test. Hepatitis C test. HIV (human immunodeficiency virus) test. STI (sexually transmitted infection) testing, if you are at risk. BRCA-related cancer screening. This may be done if you have a family history of breast, ovarian, tubal, or peritoneal cancers. Talk with your health care provider about your test results, treatment options, and if necessary, the need for more tests. Follow these instructions at home: Eating and drinking  Eat a healthy diet that includes fresh fruits and vegetables, whole grains, lean protein, and low-fat dairy products. Take vitamin and mineral supplements as recommended by your health care provider. Do not drink alcohol if: Your health care provider tells you not to drink. You are pregnant, may be pregnant, or are planning to become pregnant. If you drink alcohol: Limit how much you have to 0-1 drink a day. Know how much alcohol is in your drink. In the [place] one drink equals one 12 oz bottle of beer (355 mL), one 5 oz glass of wine (148 mL), or one 1 oz glass of hard liquor (44 mL). Lifestyle Brush your teeth every morning and night with fluoride toothpaste. Floss one time each day. Exercise for at least 30 minutes 5 or more days each week. Do not use any products that contain nicotine or tobacco. These products include cigarettes, chewing tobacco, and vaping devices, such  as e-cigarettes. If you need help quitting, ask your health care provider. Do not use drugs. If you are sexually active, practice safe sex. Use a condom or other form of protection to prevent STIs. If you do  not wish to become pregnant, use a form of birth control. If you plan to become pregnant, see your health care provider for a prepregnancy visit. Find healthy ways to manage stress, such as: Meditation, yoga, or listening to music. Journaling. Talking to a trusted person. Spending time with friends and family. Minimize exposure to UV radiation to reduce your risk of skin cancer. Safety Always wear your seat belt while driving or riding in a vehicle. Do not drive: If you have been drinking alcohol. Do not ride with someone who has been drinking. If you have been using any mind-altering substances or drugs. While texting. When you are tired or distracted. Wear a helmet and other protective equipment during sports activities. If you have firearms in your house, make sure you follow all gun safety procedures. Seek help if you have been physically or sexually abused. What's next? Go to your health care provider once a year for an annual wellness visit. Ask your health care provider how often you should have your eyes and teeth checked. Stay up to date on all vaccines. This information is not intended to replace advice given to you by your health care provider. Make sure you discuss any questions you have with your health care provider. Document Revised: 11/29/2020 Document Reviewed: 11/29/2020 Elsevier Patient Education  2024 [employer].

## 2023-04-25 NOTE — Progress Notes (Signed)
Subjective:    Patient ID: Stacey Mayer, female    DOB: [birth year], 22 y.o.   MRN: [id number]  HPI  Pt in for wellness exam.  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 Pt has been trying to work out at gym 5 days a week. Moderately successful on healthy diet. Non smoker. Rare social alcohol use.   LMP- 2 weeks ago. Is on birth control. On loestrin.   Tdap- deferred later date by pt  Flu vaccine- she states never gets.  Will get pap next month with gyn at [facility].   Review of Systems  Constitutional:  Negative for chills, fatigue and fever.  HENT:  Negative for congestion and ear pain.   Respiratory:  Negative for cough, chest tightness, shortness of breath and wheezing.   Cardiovascular:  Negative for chest pain and palpitations.  Gastrointestinal:  Negative for abdominal pain, constipation and vomiting.  Genitourinary:  Negative for dysuria.  Musculoskeletal:  Negative for back pain.  Skin:  Negative for rash.  Neurological:  Negative for dizziness, speech difficulty, weakness and light-headedness.  Hematological:  Negative for adenopathy. Does not bruise/bleed easily.  Psychiatric/Behavioral:  Negative for behavioral problems and confusion.        Objective:   Physical Exam  Past Medical History:  Diagnosis Date   Allergy    in past mild spring allergies.     Social History   Socioeconomic History   Marital status: Single    Spouse name: Not on file   Number of children: Not on file   Years of education: Not on file   Highest education level: Not on file  Occupational History   Not on file  Tobacco Use   Smoking status: Never   Smokeless tobacco: Never  Vaping Use   Vaping status: Never Used  Substance and Sexual Activity   Alcohol use: No   Drug use: No   Sexual activity: Yes    Birth control/protection: Condom  Other Topics Concern   Not on file  Social History Narrative   Not on file   Social Determinants of Health    Financial Resource Strain: Not on file  Food Insecurity: Not on file  Transportation Needs: Not on file  Physical Activity: Not on file  Stress: Not on file  Social Connections: Not on file  Intimate Partner Violence: Not on file    No past surgical history on file.  No family history on file.  No Known Allergies  Current Outpatient Medications on File Prior to Visit  Medication Sig Dispense Refill   LO LOESTRIN FE 1 MG-10 MCG / 10 MCG tablet TAKE 1 TABLET BY MOUTH EVERY DAY (Patient not taking: Reported on 12/31/2022) 84 tablet 3   nitrofurantoin, macrocrystal-monohydrate, (MACROBID) 100 MG capsule Take 1 capsule (100 mg total) by mouth 2 (two) times daily. (Patient not taking: Reported on 12/31/2022) 14 capsule 0   Norethindrone Acetate-Ethinyl Estrad-FE (LOESTRIN 24 FE) 1-20 MG-MCG(24) tablet Take 1 tablet by mouth daily. 28 tablet 11   phenazopyridine (PYRIDIUM) 200 MG tablet Take 1 tablet (200 mg total) by mouth 3 (three) times daily as needed for pain. (Patient not taking: Reported on 04/26/2022) 10 tablet 0   No current facility-administered medications on file prior to visit.    BP 116/76   Pulse 66   Temp 98 F (36.7 C)   Resp 18   Ht 5\' 2"  (1.575 m)   Wt 116 lb (52.6 kg)   LMP 04/15/2023  SpO2 100%   BMI 21.22 kg/m     General Mental Status- Alert. General Appearance- Not in acute distress.   Skin General: Color- Normal Color. Moisture- Normal Moisture.  Neck Carotid Arteries- Normal color. Moisture- Normal Moisture. No carotid bruits. No JVD.  Chest and Lung Exam Auscultation: Breath Sounds:-Normal.  Cardiovascular Auscultation:Rythm- Regular. Murmurs & Other Heart Sounds:Auscultation of the heart reveals- No Murmurs.  Abdomen Inspection:-Inspeection Normal. Palpation/Percussion:Note:No mass. Palpation and Percussion of the abdomen reveal- Non Tender, Non Distended + BS, no rebound or guarding.   Neurologic Cranial Nerve exam:- CN III-XII  intact(No nystagmus), symmetric smile. Strength:- 5/5 equal and symmetric strength both upper and lower extremities.       Assessment & Plan:   Patient Instructions  For you wellness exam today I have ordered cbc, cmp and  lipid panel.  Tdap deferred to other day. Can schedule as nurse visit later. Flu vaccine declined. Counseled to consider and can get later as nurse visit as well.  Recommend exercise and healthy diet.  We will let you know lab results as they come in.  Follow up date appointment will be determined after lab review.       Esperanza Richters, PA-C

## 2023-04-30 ENCOUNTER — Ambulatory Visit: Payer: BC Managed Care – PPO | Admitting: Family Medicine

## 2023-05-29 ENCOUNTER — Ambulatory Visit (INDEPENDENT_AMBULATORY_CARE_PROVIDER_SITE_OTHER): Payer: BC Managed Care – PPO | Admitting: Family Medicine

## 2023-05-29 ENCOUNTER — Other Ambulatory Visit (HOSPITAL_COMMUNITY)
Admission: RE | Admit: 2023-05-29 | Discharge: 2023-05-29 | Disposition: A | Discharge status: Home / Self Care | Payer: BC Managed Care – PPO | Source: Ambulatory Visit | Attending: Family Medicine | Admitting: Family Medicine

## 2023-05-29 VITALS — BP 110/65 | HR 63 | Wt 120.0 lb

## 2023-05-29 DIAGNOSIS — Z1151 Encounter for screening for human papillomavirus (HPV): Secondary | ICD-10-CM

## 2023-05-29 DIAGNOSIS — Z01419 Encounter for gynecological examination (general) (routine) without abnormal findings: Secondary | ICD-10-CM | POA: Diagnosis present

## 2023-05-29 DIAGNOSIS — Z113 Encounter for screening for infections with a predominantly sexual mode of transmission: Secondary | ICD-10-CM | POA: Diagnosis not present

## 2023-05-29 DIAGNOSIS — Z1331 Encounter for screening for depression: Secondary | ICD-10-CM | POA: Diagnosis not present

## 2023-05-29 NOTE — Progress Notes (Signed)
ANNUAL EXAM Patient name: Stacey Mayer MRN [id number]  Date of birth: 11-04-2000 Chief Complaint:   Annual Exam  History of Present Illness:   Stacey Mayer is a 22 y.o.  G0P0000  female  being seen today for a routine annual exam.  Current complaints: had irregular bleeding with COCs, so she stopped them.  Patient's last menstrual period was 05/21/2023 (exact date).   Upstream - 05/29/23 1507       Pregnancy Intention Screening   Does the patient want to become pregnant in the next year? No    Does the patient's partner want to become pregnant in the next year? No    Would the patient like to discuss contraceptive options today? No      Contraception Wrap Up   Current Method No Method - Other Reason    End Method No Method - Other Reason    Contraception Counseling Provided No    How was the end contraceptive method provided? N/A             Last pap 2021. Results were:  normal . H/O abnormal pap: no Last mammogram: n/a     05/29/2023    3:10 PM 03/19/2023    8:22 AM 12/14/2021    9:12 AM  Depression screen PHQ 2/9  Decreased Interest 0 0 1  Down, Depressed, Hopeless 0 0 1  PHQ - 2 Score 0 0 2  Altered sleeping 0  1  Tired, decreased energy 1  1  Change in appetite 0  1  Feeling bad or failure about yourself  0  1  Trouble concentrating 1  0  Moving slowly or fidgety/restless 0  0  Suicidal thoughts 0  0  PHQ-9 Score 2  6  Difficult doing work/chores   Not difficult at all        05/29/2023    3:11 PM  GAD 7 : Generalized Anxiety Score  Nervous, Anxious, on Edge 1  Control/stop worrying 0  Worry too much - different things 1  Trouble relaxing 1  Restless 0  Easily annoyed or irritable 0  Afraid - awful might happen 0  Total GAD 7 Score 3     Review of Systems:   Pertinent items are noted in HPI Denies any headaches, blurred vision, fatigue, shortness of breath, chest pain, abdominal pain, abnormal vaginal discharge/itching/odor/irritation,  problems with periods, bowel movements, urination, or intercourse unless otherwise stated above. Pertinent History Reviewed:  Reviewed past medical,surgical, social and family history.  Reviewed problem list, medications and allergies. Physical Assessment:   Vitals:   05/29/23 1504  BP: 110/65  Pulse: 63  Weight: 120 lb (54.4 kg)  Body mass index is 21.95 kg/m.        Physical Examination:   General appearance - well appearing, and in no distress  Mental status - alert, oriented to person, place, and time  Psych:  She has a normal mood and affect  Skin - warm and dry, normal color, no suspicious lesions noted  Chest - effort normal, all lung fields clear to auscultation bilaterally  Heart - normal rate and regular rhythm  Neck:  midline trachea, no thyromegaly or nodules  Breasts - breasts appear normal, no suspicious masses, no skin or nipple changes or axillary nodes  Abdomen - soft, nontender, nondistended, no masses or organomegaly  Pelvic - VULVA: normal appearing vulva with no masses, tenderness or lesions  VAGINA: normal appearing vagina with normal color and discharge, no lesions  CERVIX: normal appearing cervix without discharge or lesions, no CMT  Thin prep pap is done   UTERUS: uterus is felt to be normal size, shape, consistency and nontender   ADNEXA: No adnexal masses or tenderness noted.  Extremities:  No swelling or varicosities noted  Chaperone present for exam  Assessment & Plan:  1. Well woman exam with routine gynecological exam (Primary) STI screening desired.   Labs/procedures today:   No orders of the defined types were placed in this encounter.   Meds: No orders of the defined types were placed in this encounter.   Follow-up: No follow-ups on file.  Levie Heritage, DO 05/29/2023 3:33 PM

## 2023-05-30 LAB — HEPATITIS B SURFACE ANTIGEN: Hepatitis B Surface Ag: NEGATIVE

## 2023-05-30 LAB — HIV ANTIBODY (ROUTINE TESTING W REFLEX): HIV Screen 4th Generation wRfx: NONREACTIVE

## 2023-05-30 LAB — HEPATITIS C ANTIBODY: Hep C Virus Ab: NONREACTIVE

## 2023-05-30 LAB — RPR: RPR Ser Ql: NONREACTIVE

## 2023-06-04 LAB — CYTOLOGY - PAP
Chlamydia: NEGATIVE
Comment: NEGATIVE
Comment: NEGATIVE
Comment: NORMAL
Diagnosis: NEGATIVE
Neisseria Gonorrhea: NEGATIVE
Trichomonas: NEGATIVE

## 2023-08-24 ENCOUNTER — Emergency Department (HOSPITAL_BASED_OUTPATIENT_CLINIC_OR_DEPARTMENT_OTHER)

## 2023-08-24 ENCOUNTER — Emergency Department (HOSPITAL_BASED_OUTPATIENT_CLINIC_OR_DEPARTMENT_OTHER)
Admission: EM | Admit: 2023-08-24 | Discharge: 2023-08-24 | Disposition: A | Discharge status: Home / Self Care | Attending: Emergency Medicine | Admitting: Emergency Medicine

## 2023-08-24 ENCOUNTER — Encounter (HOSPITAL_BASED_OUTPATIENT_CLINIC_OR_DEPARTMENT_OTHER): Payer: Self-pay

## 2023-08-24 ENCOUNTER — Other Ambulatory Visit: Payer: Self-pay

## 2023-08-24 DIAGNOSIS — D72819 Decreased white blood cell count, unspecified: Secondary | ICD-10-CM | POA: Diagnosis not present

## 2023-08-24 DIAGNOSIS — R1013 Epigastric pain: Secondary | ICD-10-CM | POA: Insufficient documentation

## 2023-08-24 DIAGNOSIS — E876 Hypokalemia: Secondary | ICD-10-CM | POA: Diagnosis not present

## 2023-08-24 DIAGNOSIS — E871 Hypo-osmolality and hyponatremia: Secondary | ICD-10-CM | POA: Diagnosis not present

## 2023-08-24 DIAGNOSIS — R110 Nausea: Secondary | ICD-10-CM | POA: Diagnosis not present

## 2023-08-24 LAB — URINALYSIS, ROUTINE W REFLEX MICROSCOPIC
Glucose, UA: NEGATIVE mg/dL
Hgb urine dipstick: NEGATIVE
Ketones, ur: >=80 mg/dL — AB
Nitrite: NEGATIVE
Protein, ur: 30 mg/dL — AB
Specific Gravity, Urine: >=1.03 (ref 1.005–1.030)
pH: 6 (ref 5.0–8.0)

## 2023-08-24 LAB — CBC
HCT: 41.1 % (ref 36.0–46.0)
Hemoglobin: 14.1 g/dL (ref 12.0–15.0)
MCH: 31 pg (ref 26.0–34.0)
MCHC: 34.3 g/dL (ref 30.0–36.0)
MCV: 90.3 fL (ref 80.0–100.0)
Platelets: 200 10*3/uL (ref 150–400)
RBC: 4.55 MIL/uL (ref 3.87–5.11)
RDW: 12.2 % (ref 11.5–15.5)
WBC: 3.4 10*3/uL — ABNORMAL LOW (ref 4.0–10.5)
nRBC: 0 % (ref 0.0–0.2)

## 2023-08-24 LAB — COMPREHENSIVE METABOLIC PANEL
ALT: 27 U/L (ref 0–44)
AST: 26 U/L (ref 15–41)
Albumin: 4.1 g/dL (ref 3.5–5.0)
Alkaline Phosphatase: 41 U/L (ref 38–126)
Anion gap: 10 (ref 5–15)
BUN: 14 mg/dL (ref 6–20)
CO2: 20 mmol/L — ABNORMAL LOW (ref 22–32)
Calcium: 8.3 mg/dL — ABNORMAL LOW (ref 8.9–10.3)
Chloride: 101 mmol/L (ref 98–111)
Creatinine, Ser: 0.55 mg/dL (ref 0.44–1.00)
GFR, Estimated: >60 mL/min (ref >60)
Glucose, Bld: 111 mg/dL — ABNORMAL HIGH (ref 70–99)
Potassium: 3.3 mmol/L — ABNORMAL LOW (ref 3.5–5.1)
Sodium: 131 mmol/L — ABNORMAL LOW (ref 135–145)
Total Bilirubin: 1 mg/dL (ref 0.0–1.2)
Total Protein: 7.4 g/dL (ref 6.5–8.1)

## 2023-08-24 LAB — RESP PANEL BY RT-PCR (RSV, FLU A&B, COVID)  RVPGX2
Influenza A by PCR: NEGATIVE
Influenza B by PCR: NEGATIVE
Resp Syncytial Virus by PCR: NEGATIVE
SARS Coronavirus 2 by RT PCR: NEGATIVE

## 2023-08-24 LAB — PREGNANCY, URINE: Preg Test, Ur: NEGATIVE

## 2023-08-24 LAB — URINALYSIS, MICROSCOPIC (REFLEX)

## 2023-08-24 LAB — MAGNESIUM: Magnesium: 1.6 mg/dL — ABNORMAL LOW (ref 1.7–2.4)

## 2023-08-24 LAB — LIPASE, BLOOD: Lipase: 23 U/L (ref 11–51)

## 2023-08-24 MED ORDER — MAALOX MAX 400-400-40 MG/5ML PO SUSP: 10.0000 mL | Freq: Four times a day (QID) | ORAL | 0 refills | Status: AC | PRN

## 2023-08-24 MED ORDER — SODIUM CHLORIDE 0.9 % IV BOLUS
1000.0000 mL | Freq: Once | INTRAVENOUS | Status: AC
  Administered 2023-08-24: 1000 mL via INTRAVENOUS

## 2023-08-24 MED ORDER — ONDANSETRON 4 MG PO TBDP
4.0000 mg | ORAL_TABLET | Freq: Once | ORAL | Status: AC
  Administered 2023-08-24: 4 mg via ORAL
  Filled 2023-08-24: qty 1

## 2023-08-24 MED ORDER — POTASSIUM CHLORIDE CRYS ER 20 MEQ PO TBCR
40.0000 meq | EXTENDED_RELEASE_TABLET | Freq: Once | ORAL | Status: AC
  Administered 2023-08-24: 40 meq via ORAL
  Filled 2023-08-24: qty 2

## 2023-08-24 MED ORDER — FAMOTIDINE 20 MG PO TABS
20.0000 mg | ORAL_TABLET | Freq: Once | ORAL | Status: AC
  Administered 2023-08-24: 20 mg via ORAL
  Filled 2023-08-24: qty 1

## 2023-08-24 MED ORDER — CEFADROXIL 500 MG PO CAPS
500.0000 mg | ORAL_CAPSULE | Freq: Two times a day (BID) | ORAL | 0 refills | Status: AC
Start: 2023-08-24 — End: ?

## 2023-08-24 MED ORDER — IOHEXOL 300 MG/ML  SOLN
80.0000 mL | Freq: Once | INTRAMUSCULAR | Status: AC | PRN
  Administered 2023-08-24: 80 mL via INTRAVENOUS

## 2023-08-24 MED ORDER — ALUM & MAG HYDROXIDE-SIMETH 200-200-20 MG/5ML PO SUSP
30.0000 mL | Freq: Once | ORAL | Status: AC
  Administered 2023-08-24: 30 mL via ORAL
  Filled 2023-08-24: qty 30

## 2023-08-24 MED ORDER — MAGNESIUM OXIDE -MG SUPPLEMENT 400 (240 MG) MG PO TABS
400.0000 mg | ORAL_TABLET | Freq: Once | ORAL | Status: AC
  Administered 2023-08-24: 400 mg via ORAL
  Filled 2023-08-24: qty 1

## 2023-08-24 MED ORDER — PANTOPRAZOLE SODIUM 20 MG PO TBEC: 20.0000 mg | DELAYED_RELEASE_TABLET | Freq: Every day | ORAL | 0 refills | Status: AC

## 2023-08-24 NOTE — ED Triage Notes (Signed)
 Reports upper abd pressure since Saturday and mid back pain since yesterday. Reports nausea and diarrhea

## 2023-08-24 NOTE — Discharge Instructions (Addendum)
 As discussed, your workup today was overall reassuring.  Suspect your pain is likely from your stomach such as GERD versus gastritis.  Will place you on a couple of medicines for treatment of this.  See information attached your discharge papers regarding foods, medicines to avoid.  Your urine also showed signs of infection so we will place you on antibiotics for this.  Recommend follow-up with your primary care for reassessment.  Please not hesitate to return if the worrisome signs and symptoms we discussed become apparent.

## 2023-08-24 NOTE — ED Provider Notes (Signed)
 [facility] Provider Note   CSN: [id number] Arrival date & time: 08/24/23  1837     History  Chief Complaint  Patient presents with   Abdominal Pain    Stacey Mayer is a 23 y.o. female.   Abdominal Pain   23 year old female presents emergency department with complaints of abdominal pain, nausea.  Reports upper abdominal pain with some radiation to her mid back.  States the pain has been present for the past couple of days.  States the pain occurs at random times without any known exacerbating or relieving factors.  States she did take Pepto-Bismol however noted slight improvement.  Denies any fevers, chills, vomiting, urinary symptoms, vaginal symptoms, change in bowel habits.  Past medical history significant for seasonal allergies  Home Medications Prior to Admission medications   Medication Sig Start Date End Date Taking? Authorizing Provider  alum & mag hydroxide-simeth (MAALOX MAX) 400-400-40 MG/5ML suspension Take 10 mLs by mouth every 6 (six) hours as needed for indigestion. 08/24/23  Yes Sherian Maroon A, PA  cefadroxil (DURICEF) 500 MG capsule Take 1 capsule (500 mg total) by mouth 2 (two) times daily. 08/24/23  Yes Sherian Maroon A, PA  pantoprazole (PROTONIX) 20 MG tablet Take 1 tablet (20 mg total) by mouth daily. 08/24/23  Yes Wisdom Seybold A, PA  LO LOESTRIN FE 1 MG-10 MCG / 10 MCG tablet TAKE 1 TABLET BY MOUTH EVERY DAY Patient not taking: Reported on 05/29/2023 07/12/22   Levie Heritage, DO  nitrofurantoin, macrocrystal-monohydrate, (MACROBID) 100 MG capsule Take 1 capsule (100 mg total) by mouth 2 (two) times daily. Patient not taking: Reported on 05/29/2023 11/13/22   Levie Heritage, DO  Norethindrone Acetate-Ethinyl Estrad-FE (LOESTRIN 24 FE) 1-20 MG-MCG(24) tablet Take 1 tablet by mouth daily. Patient not taking: Reported on 05/29/2023 11/13/21   Aviva Signs, CNM  phenazopyridine (PYRIDIUM) 200 MG tablet Take 1  tablet (200 mg total) by mouth 3 (three) times daily as needed for pain. Patient not taking: Reported on 05/29/2023 01/16/22   Saguier, Ramon Dredge, PA-C      Allergies    Patient has no known allergies.    Review of Systems   Review of Systems  Gastrointestinal:  Positive for abdominal pain.  All other systems reviewed and are negative.   Physical Exam Updated Vital Signs BP 114/67   Pulse 85   Temp 98.8 F (37.1 C) (Oral)   Ht 5\' 2"  (1.575 m)   Wt 53.5 kg   LMP 08/09/2023 (Approximate)   SpO2 100%   BMI 21.58 kg/m  Physical Exam Vitals and nursing note reviewed.  Constitutional:      General: She is not in acute distress.    Appearance: She is well-developed.  HENT:     Head: Normocephalic and atraumatic.  Eyes:     Conjunctiva/sclera: Conjunctivae normal.  Cardiovascular:     Rate and Rhythm: Normal rate and regular rhythm.     Heart sounds: No murmur heard. Pulmonary:     Effort: Pulmonary effort is normal. No respiratory distress.     Breath sounds: Normal breath sounds.  Abdominal:     Palpations: Abdomen is soft.     Tenderness: There is abdominal tenderness in the epigastric area and suprapubic area. There is no right CVA tenderness, left CVA tenderness or guarding.  Musculoskeletal:        General: No swelling.     Cervical back: Neck supple.  Skin:  General: Skin is warm and dry.     Capillary Refill: Capillary refill takes less than 2 seconds.  Neurological:     Mental Status: She is alert.  Psychiatric:        Mood and Affect: Mood normal.     ED Results / Procedures / Treatments   Labs (all labs ordered are listed, but only abnormal results are displayed) Labs Reviewed  COMPREHENSIVE METABOLIC PANEL - Abnormal; Notable for the following components:      Result Value   Sodium 131 (*)    Potassium 3.3 (*)    CO2 20 (*)    Glucose, Bld 111 (*)    Calcium 8.3 (*)    All other components within normal limits  CBC - Abnormal; Notable for the  following components:   WBC 3.4 (*)    All other components within normal limits  URINALYSIS, ROUTINE W REFLEX MICROSCOPIC - Abnormal; Notable for the following components:   Bilirubin Urine SMALL (*)    Ketones, ur >=80 (*)    Protein, ur 30 (*)    Leukocytes,Ua TRACE (*)    All other components within normal limits  MAGNESIUM - Abnormal; Notable for the following components:   Magnesium 1.6 (*)    All other components within normal limits  URINALYSIS, MICROSCOPIC (REFLEX) - Abnormal; Notable for the following components:   Bacteria, UA FEW (*)    All other components within normal limits  RESP PANEL BY RT-PCR (RSV, FLU A&B, COVID)  RVPGX2  LIPASE, BLOOD  PREGNANCY, URINE    EKG None  Radiology CT ABDOMEN PELVIS W CONTRAST Result Date: 08/24/2023 CLINICAL DATA:  Abdominal pain, acute, nonlocalized Reports upper abd pressure since Saturday and mid back pain since yesterday. Reports nausea and diarrhea EXAM: CT ABDOMEN AND PELVIS WITH CONTRAST TECHNIQUE: Multidetector CT imaging of the abdomen and pelvis was performed using the standard protocol following bolus administration of intravenous contrast. RADIATION DOSE REDUCTION: This exam was performed according to the departmental dose-optimization program which includes automated exposure control, adjustment of the mA and/or kV according to patient size and/or use of iterative reconstruction technique. CONTRAST:  80mL OMNIPAQUE IOHEXOL 300 MG/ML  SOLN COMPARISON:  None Available. FINDINGS: Lower chest: No acute abnormality. Hepatobiliary: No focal liver abnormality. No gallstones, gallbladder wall thickening, or pericholecystic fluid. No biliary dilatation. Pancreas: No focal lesion. Normal pancreatic contour. No surrounding inflammatory changes. No main pancreatic ductal dilatation. Spleen: Normal in size without focal abnormality. Adrenals/Urinary Tract: No adrenal nodule bilaterally. Bilateral kidneys enhance symmetrically. No  hydronephrosis. No hydroureter. The urinary bladder is unremarkable. Stomach/Bowel: Stomach is within normal limits. No evidence of bowel wall thickening or dilatation. Appendix appears normal. Vascular/Lymphatic: No abdominal aorta or iliac aneurysm. No abdominal, pelvic, or inguinal lymphadenopathy. Reproductive: Corpus luteum cyst noted within the right ovary-no further follow-up indicated. Otherwise uterus and bilateral adnexa are unremarkable. Other: No intraperitoneal free fluid. No intraperitoneal free gas. No organized fluid collection. Musculoskeletal: No abdominal wall hernia or abnormality. No suspicious lytic or blastic osseous lesions. No acute displaced fracture. IMPRESSION: No acute intra-abdominal or intrapelvic abnormality. Electronically Signed   By: Tish Frederickson M.D.   On: 08/24/2023 21:04    Procedures Procedures    Medications Ordered in ED Medications  sodium chloride 0.9 % bolus 1,000 mL (0 mLs Intravenous Stopped 08/24/23 2123)  alum & mag hydroxide-simeth (MAALOX/MYLANTA) 200-200-20 MG/5ML suspension 30 mL (30 mLs Oral Given 08/24/23 1957)  famotidine (PEPCID) tablet 20 mg (20 mg Oral Given 08/24/23 1957)  iohexol (OMNIPAQUE) 300 MG/ML solution 80 mL (80 mLs Intravenous Contrast Given 08/24/23 2049)  magnesium oxide (MAG-OX) tablet 400 mg (400 mg Oral Given 08/24/23 2122)  potassium chloride SA (KLOR-CON M) CR tablet 40 mEq (40 mEq Oral Given 08/24/23 2122)  ondansetron (ZOFRAN-ODT) disintegrating tablet 4 mg (4 mg Oral Given 08/24/23 2122)    ED Course/ Medical Decision Making/ A&P                                 Medical Decision Making Amount and/or Complexity of Data Reviewed Labs: ordered. Radiology: ordered.  Risk OTC drugs. Prescription drug management.   This patient presents to the ED for concern of abdominal pain, this involves an extensive number of treatment options, and is a complaint that carries with it a high risk of complications and morbidity.  The  differential diagnosis includes gastritis, PUD, cholecystitis, SBO/LBO, volvulus, diverticulitis, appendicitis, ectopic pregnancy, tubo-ovarian abscess, PID, CBD pathology, peritonitis, pancreatitis, other   Co morbidities that complicate the patient evaluation  See HPI   Additional history obtained:  Additional history obtained from EMR External records from outside source obtained and reviewed including hospital records   Lab Tests:  I Ordered, and personally interpreted labs.  The pertinent results include: Leukopenia of 3.4.  No evidence of anemia.  Platelets within range.  Hyponatremia, hypokalemia, decreased bicarb of 131, 3.3 and 20 respectively.  No transaminitis.  No renal dysfunction.  Lipase negative.  UA with few bacteria, 11-20 WBCs with trace leukocytes but contamination 11-20 squamous epithelial cells.  Urine pregnancy negative.  Hypomagnesemia 1.6.  Viral testing negative.   Imaging Studies ordered:  I ordered imaging studies including CT abdomen pelvis I independently visualized and interpreted imaging which showed no acute abnormality. I agree with the radiologist interpretation   Cardiac Monitoring: / EKG:  The patient was maintained on a cardiac monitor.  I personally viewed and interpreted the cardiac monitored which showed an underlying rhythm of: Sinus rhythm   Consultations Obtained:  N/a   Problem List / ED Course / Critical interventions / Medication management  Epigastric abdominal pain I ordered medication including 1 L normal saline, Mag-Ox, potassium chloride, Zofran, Maalox, Pepcid   Reevaluation of the patient after these medicines showed that the patient improved I have reviewed the patients home medicines and have made adjustments as needed   Social Determinants of Health:  Denies tobacco, illicit drug use.   Test / Admission - Considered:  Epigastric abdominal pain Vitals signs within normal range and stable throughout  visit. Laboratory/imaging studies significant for: See above 23 year old female presents emergency department with complaints of abdominal pain, nausea.  Reports upper abdominal pain with some radiation to her mid back.  States the pain has been present for the past couple of days.  States the pain occurs at random times without any known exacerbating or relieving factors.  States she did take Pepto-Bismol however noted slight improvement.  Denies any fevers, chills, vomiting, urinary symptoms, vaginal symptoms, change in bowel habits. On exam, tenderness epigastric region as well as suprapubic region.  Laboratory studies concerning for dehydration with multiple electrolyte abnormalities.  UA with possible infection but with continued sample of 11-20 squamous epithelial cells; will treat empirically given suprapubic tenderness on exam despite lack of urinary symptoms.  CT imaging was obtained at patient's request of which was negative for any acute process.  Patient treated with GI cocktail and noted significant improvement  in symptoms.  Suspect the patient's symptoms likely secondary to GERD versus gastritis.  Recommend lifestyle modifications as well as symptomatic therapy as described AVS.  Close follow-up with PCP recommended for reevaluation.  Treatment plan discussed at length with patient and she knowledge understanding was agreeable to said plan.  Patient overall well-appearing, afebrile in no acute distress. Worrisome signs and symptoms were discussed with the patient, and the patient acknowledged understanding to return to the ED if noticed. Patient was stable upon discharge.          Final Clinical Impression(s) / ED Diagnoses Final diagnoses:  Epigastric pain    Rx / DC Orders ED Discharge Orders          Ordered    pantoprazole (PROTONIX) 20 MG tablet  Daily        08/24/23 2129    alum & mag hydroxide-simeth (MAALOX MAX) 400-400-40 MG/5ML suspension  Every 6 hours PRN         08/24/23 2129    cefadroxil (DURICEF) 500 MG capsule  2 times daily        08/24/23 2130              Peter Garter, [place] 08/24/23 2132    Rozelle Logan, DO 08/24/23 2314

## 2023-08-26 ENCOUNTER — Ambulatory Visit (INDEPENDENT_AMBULATORY_CARE_PROVIDER_SITE_OTHER): Admitting: Medical

## 2023-08-26 VITALS — BP 110/64 | HR 92 | Resp 18 | Ht 62.0 in | Wt 110.0 lb

## 2023-08-26 DIAGNOSIS — R197 Diarrhea, unspecified: Secondary | ICD-10-CM

## 2023-08-26 DIAGNOSIS — E876 Hypokalemia: Secondary | ICD-10-CM

## 2023-08-26 DIAGNOSIS — K219 Gastro-esophageal reflux disease without esophagitis: Secondary | ICD-10-CM | POA: Diagnosis not present

## 2023-08-26 DIAGNOSIS — Z23 Encounter for immunization: Secondary | ICD-10-CM

## 2023-08-26 DIAGNOSIS — R8271 Bacteriuria: Secondary | ICD-10-CM

## 2023-08-26 NOTE — Progress Notes (Signed)
 Subjective:    Patient ID: Stacey Mayer, female    DOB: March 29, 2001, 23 y.o.   MRN: [id number]  HPI  Discussed the use of AI scribe software for clinical note transcription with the patient, who gave verbal consent to proceed.  History of Present Illness   The patient presents with severe abdominal pain and pressure.  She has been experiencing severe abdominal pain described as 'really bad pressure' since Thursday afternoon. The pain began in the upper abdomen with a tightening and releasing sensation and by Sunday, it radiated to her back, prompting her to seek urgent care. Work up was negative. Pain was prominent epigastric and tight/crampy sensation.  On Friday, she developed an upset stomach and diarrhea, which persisted through the weekend. By Sunday, the severity of her symptoms prevented her from eating and caused difficulty sleeping due to discomfort. No diarrhea presently/that resolved quickly.  At urgent care, blood work indicated trace infection cells, dehydration, and low magnesium levels. She was treated with potassium, Zofran, Pepcid, and a Maalox and Mylanta mix. She reports significant improvement in her symptoms after starting protonix  yesterday, taking two doses so far, one yesterday and one this morning. She was also prescribed Duricef for a suspected urinary infection, taking 500 mg twice a day for five days, with four doses taken so far. No fever, chills, sweats, or vomiting currently.  She recalls eating a spicy chicken sandwich from [facility] on Thursday, which she suspects may have contributed to her symptoms. She has no prior history of heartburn or reflux.  No belching, burning, or burping.            Review of Systems  Constitutional:  Negative for chills, fatigue and fever.  HENT:  Negative for congestion and ear pain.   Respiratory:  Negative for cough, chest tightness and wheezing.   Cardiovascular:  Negative for chest pain and palpitations.   Gastrointestinal:  Negative for abdominal pain, constipation, nausea and vomiting.       See hpi. Only faint residual epigastric pain at best but only on exam.  Genitourinary:  Negative for flank pain, frequency and urgency.  Musculoskeletal:  Negative for back pain, myalgias and neck stiffness.  Skin:  Negative for rash.  Neurological:  Negative for facial asymmetry, weakness and light-headedness.  Hematological:  Negative for adenopathy. Does not bruise/bleed easily.  Psychiatric/Behavioral:  Negative for behavioral problems and decreased concentration.     Past Medical History:  Diagnosis Date   Allergy    in past mild spring allergies.     Social History   Socioeconomic History   Marital status: Single    Spouse name: Not on file   Number of children: Not on file   Years of education: Not on file   Highest education level: Not on file  Occupational History   Not on file  Tobacco Use   Smoking status: Never   Smokeless tobacco: Never  Vaping Use   Vaping status: Never Used  Substance and Sexual Activity   Alcohol use: No   Drug use: No   Sexual activity: Yes    Birth control/protection: Condom  Other Topics Concern   Not on file  Social History Narrative   Not on file   Social Drivers of Health   Financial Resource Strain: Not on file  Food Insecurity: Not on file  Transportation Needs: Not on file  Physical Activity: Not on file  Stress: Not on file  Social Connections: Not on file  Intimate Partner  Violence: Not on file    No past surgical history on file.  No family history on file.  No Known Allergies  Current Outpatient Medications on File Prior to Visit  Medication Sig Dispense Refill   alum & mag hydroxide-simeth (MAALOX MAX) 400-400-40 MG/5ML suspension Take 10 mLs by mouth every 6 (six) hours as needed for indigestion. 355 mL 0   cefadroxil (DURICEF) 500 MG capsule Take 1 capsule (500 mg total) by mouth 2 (two) times daily. 10 capsule 0    pantoprazole (PROTONIX) 20 MG tablet Take 1 tablet (20 mg total) by mouth daily. 30 tablet 0   No current facility-administered medications on file prior to visit.    BP 110/64   Pulse 92   Resp 18   Ht 5\' 2"  (1.575 m)   Wt 110 lb (49.9 kg)   LMP 08/09/2023 (Approximate)   SpO2 99%   BMI 20.12 kg/m        Objective:   Physical Exam  General Mental Status- Alert. General Appearance- Not in acute distress.   Skin General: Color- Normal Color. Moisture- Normal Moisture.  Neck Carotid Arteries- Normal color. Moisture- Normal Moisture. No carotid bruits. No JVD.  Chest and Lung Exam Auscultation: Breath Sounds:-Normal.  Cardiovascular Auscultation:Rythm- Regular. Murmurs & Other Heart Sounds:Auscultation of the heart reveals- No Murmurs.  Abdomen Inspection:-Inspeection Normal. Palpation/Percussion:Note:No mass. Palpation and Percussion of the abdomen reveal- Non Tender, Non Distended + BS, no rebound or guarding.   Neurologic Cranial Nerve exam:- CN III-XII intact(No nystagmus), symmetric smile. Strength:- 5/5 equal and symmetric strength both upper and lower extremities.       Assessment & Plan:   Assessment and Plan    Gastroesophageal Reflux Disease (GERD) Symptoms improved with protonix, indicating GERD.. Dietary modifications discussed to prevent reflux. - Prescribe Protonix for two weeks. - Advise dietary modifications to prevent reflux. - Reassess symptoms after two weeks of Protonix treatment. - Consider bacterial breath test if symptoms recur.  Urinary Tract Infection (UTI) Trace infection findings suggest UTI. Duricef prescribed. Treatment expected to resolve infection. - Continue Duricef 500 mg twice a day for five days.  Electrolyte Imbalance Low magnesium and potassium levels likely due to dehydration and inadequate nutrition. Supplementation expected to normalize levels. - Recommend magnesium supplementation of 400 mg daily for four days. -  Order metabolic panel to check magnesium and potassium levels on Monday. - Advise dietary intake of high potassium foods.  General Health Maintenance Records indicate Tdap vaccine in 2011. TD booster recommended. - Administer TD booster vaccine.       Esperanza Richters, PA-C

## 2023-08-26 NOTE — Addendum Note (Signed)
 Addended by: Maximino Sarin on: 08/26/2023 10:26 AM   Modules accepted: Orders

## 2023-08-26 NOTE — Patient Instructions (Signed)
 Gastroesophageal Reflux Disease (GERD) Symptoms improved with protonix, indicating GERD.. Dietary modifications discussed to prevent reflux. - Prescribe Protonix for two weeks. - Advise dietary modifications to prevent reflux. - Reassess symptoms after two weeks of Protonix treatment. - Consider bacterial breath test if symptoms recur.  Urinary Tract Infection (UTI) Trace infection findings suggest UTI. Duricef prescribed. Treatment expected to resolve infection. - Continue Duricef 500 mg twice a day for five days.  Electrolyte Imbalance Low magnesium and potassium levels likely due to dehydration and inadequate nutrition. Supplementation expected to normalize levels. - Recommend magnesium supplementation of 400 mg daily for four days. - Order metabolic panel to check magnesium and potassium levels on Monday. - Advise dietary intake of high potassium foods.  General Health Maintenance Records indicate Tdap vaccine in 2011. TD booster recommended. - Administer TD booster vaccine.

## 2023-08-29 ENCOUNTER — Encounter: Payer: Self-pay | Admitting: Physician Assistant

## 2023-08-29 ENCOUNTER — Encounter: Payer: Self-pay | Admitting: Medical

## 2023-08-30 NOTE — Addendum Note (Signed)
 Addended by: Gwenevere Abbot on: 08/30/2023 09:35 PM   Modules accepted: Orders

## 2023-09-01 ENCOUNTER — Other Ambulatory Visit

## 2023-10-21 ENCOUNTER — Ambulatory Visit: Admitting: Physician Assistant

## 2023-12-29 ENCOUNTER — Encounter: Payer: Self-pay | Admitting: Family Medicine

## 2023-12-30 ENCOUNTER — Other Ambulatory Visit (HOSPITAL_COMMUNITY)
Admission: RE | Admit: 2023-12-30 | Discharge: 2023-12-30 | Disposition: A | Discharge status: Home / Self Care | Source: Ambulatory Visit | Attending: Obstetrics and Gynecology | Admitting: Obstetrics and Gynecology

## 2023-12-30 ENCOUNTER — Ambulatory Visit

## 2023-12-30 VITALS — BP 112/62 | HR 55 | Ht 62.0 in | Wt 114.0 lb

## 2023-12-30 DIAGNOSIS — N898 Other specified noninflammatory disorders of vagina: Secondary | ICD-10-CM | POA: Insufficient documentation

## 2023-12-30 NOTE — Progress Notes (Signed)
 SUBJECTIVE:  23 y.o. female who desires testing for yeast.  Abnormal vaginal discharge x 2 days.  Light yellow in color.  Slight itching noted. Denies bleeding or significant pelvic pain. No UTI symptoms.   Patient's last menstrual period was 12/18/2023 (exact date).  OBJECTIVE:  She appears well.   ASSESSMENT:  Vag culture for yeast and BV.  PLAN:  Swab for yeast and BV sent to lab. Treatment: To be determined once lab results are received.  Pt follow up as needed.

## 2023-12-30 NOTE — Addendum Note (Signed)
 Addended by: VENUS AMERICA A on: 12/30/2023 09:39 AM   Modules accepted: Orders

## 2023-12-31 ENCOUNTER — Ambulatory Visit: Payer: Self-pay | Admitting: Obstetrics and Gynecology

## 2023-12-31 LAB — CERVICOVAGINAL ANCILLARY ONLY
Bacterial Vaginitis (gardnerella): NEGATIVE
Candida Glabrata: NEGATIVE
Candida Vaginitis: NEGATIVE
Chlamydia: NEGATIVE
Comment: NEGATIVE
Comment: NEGATIVE
Comment: NEGATIVE
Comment: NEGATIVE
Comment: NEGATIVE
Comment: NORMAL
Neisseria Gonorrhea: NEGATIVE
Trichomonas: NEGATIVE

## 2024-07-13 ENCOUNTER — Ambulatory Visit
